# Patient Record
Sex: Female | Born: 2000
Health system: Southern US, Community
[De-identification: ages and names within clinical notes are randomized; demographics above are authoritative.]

## PROBLEM LIST (undated history)

## (undated) DIAGNOSIS — F411 Generalized anxiety disorder: Secondary | ICD-10-CM

## (undated) DIAGNOSIS — R011 Cardiac murmur, unspecified: Secondary | ICD-10-CM

## (undated) DIAGNOSIS — F329 Major depressive disorder, single episode, unspecified: Secondary | ICD-10-CM

## (undated) DIAGNOSIS — J45909 Unspecified asthma, uncomplicated: Secondary | ICD-10-CM

## (undated) HISTORY — DX: Cardiac murmur, unspecified: R01.1

## (undated) HISTORY — DX: Major depressive disorder, single episode, unspecified: F32.9

## (undated) HISTORY — DX: Generalized anxiety disorder: F41.1

---
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
## 2019-07-15 ENCOUNTER — Ambulatory Visit: Payer: Self-pay | Admitting: Orthopedic Surgery

## 2019-07-28 ENCOUNTER — Ambulatory Visit: Payer: Self-pay | Admitting: Orthopedic Surgery

## 2019-07-28 ENCOUNTER — Encounter (HOSPITAL_COMMUNITY)
Admission: RE | Admit: 2019-07-28 | Discharge: 2019-07-28 | Disposition: A | Discharge status: Home / Self Care | Payer: 59 | Source: Ambulatory Visit | Attending: Orthopedic Surgery | Admitting: Orthopedic Surgery

## 2019-07-28 ENCOUNTER — Other Ambulatory Visit: Payer: Self-pay

## 2019-07-28 ENCOUNTER — Other Ambulatory Visit (HOSPITAL_COMMUNITY)
Admission: RE | Admit: 2019-07-28 | Discharge: 2019-07-28 | Disposition: A | Discharge status: Home / Self Care | Payer: 59 | Source: Ambulatory Visit | Attending: Orthopedic Surgery | Admitting: Orthopedic Surgery

## 2019-07-28 ENCOUNTER — Encounter (HOSPITAL_COMMUNITY): Payer: Self-pay

## 2019-07-28 ENCOUNTER — Ambulatory Visit (HOSPITAL_COMMUNITY)
Admission: RE | Admit: 2019-07-28 | Discharge: 2019-07-28 | Disposition: A | Discharge status: Home / Self Care | Payer: 59 | Source: Ambulatory Visit | Attending: Orthopedic Surgery | Admitting: Orthopedic Surgery

## 2019-07-28 DIAGNOSIS — Z01810 Encounter for preprocedural cardiovascular examination: Secondary | ICD-10-CM | POA: Insufficient documentation

## 2019-07-28 DIAGNOSIS — Z01818 Encounter for other preprocedural examination: Secondary | ICD-10-CM

## 2019-07-28 DIAGNOSIS — Z20822 Contact with and (suspected) exposure to covid-19: Secondary | ICD-10-CM | POA: Insufficient documentation

## 2019-07-28 DIAGNOSIS — Z01812 Encounter for preprocedural laboratory examination: Secondary | ICD-10-CM | POA: Diagnosis present

## 2019-07-28 HISTORY — DX: Unspecified asthma, uncomplicated: J45.909

## 2019-07-28 LAB — URINALYSIS, ROUTINE W REFLEX MICROSCOPIC
Bilirubin Urine: NEGATIVE
Glucose, UA: NEGATIVE mg/dL
Hgb urine dipstick: NEGATIVE
Ketones, ur: NEGATIVE mg/dL
Leukocytes,Ua: NEGATIVE
Nitrite: NEGATIVE
Protein, ur: NEGATIVE mg/dL
Specific Gravity, Urine: 1.023 (ref 1.005–1.030)
pH: 5 (ref 5.0–8.0)

## 2019-07-28 LAB — CBC
HCT: 41.8 % (ref 36.0–46.0)
Hemoglobin: 14.2 g/dL (ref 12.0–15.0)
MCH: 30 pg (ref 26.0–34.0)
MCHC: 34 g/dL (ref 30.0–36.0)
MCV: 88.4 fL (ref 80.0–100.0)
Platelets: 315 10*3/uL (ref 150–400)
RBC: 4.73 MIL/uL (ref 3.87–5.11)
RDW: 12.7 % (ref 11.5–15.5)
WBC: 8.3 10*3/uL (ref 4.0–10.5)
nRBC: 0 % (ref 0.0–0.2)

## 2019-07-28 LAB — BASIC METABOLIC PANEL
Anion gap: 10 (ref 5–15)
BUN: 8 mg/dL (ref 6–20)
CO2: 24 mmol/L (ref 22–32)
Calcium: 9.1 mg/dL (ref 8.9–10.3)
Chloride: 100 mmol/L (ref 98–111)
Creatinine, Ser: 0.81 mg/dL (ref 0.44–1.00)
GFR calc Af Amer: >60 mL/min (ref >60)
GFR calc non Af Amer: >60 mL/min (ref >60)
Glucose, Bld: 105 mg/dL — ABNORMAL HIGH (ref 70–99)
Potassium: 3.2 mmol/L — ABNORMAL LOW (ref 3.5–5.1)
Sodium: 134 mmol/L — ABNORMAL LOW (ref 135–145)

## 2019-07-28 LAB — PROTIME-INR
INR: 1.1 (ref 0.8–1.2)
Prothrombin Time: 13.8 seconds (ref 11.4–15.2)

## 2019-07-28 LAB — SARS CORONAVIRUS 2 (TAT 6-24 HRS): SARS Coronavirus 2: NEGATIVE

## 2019-07-28 LAB — APTT: aPTT: 33 seconds (ref 24–36)

## 2019-07-28 NOTE — H&P (Signed)
Subjective:   Michelle Blanchard is a very pleasant 19 year old woman who is been having severe radicular left leg pain for some time now. . Fortunately she has not had any episode of bowel or bladder incontinence to suggest a cauda equina syndrome. Her MRI clearly shows a large central disc herniation slightly worse to the left causing severe central stenosis and neural compression. At this point despite injection therapy and attempted physical therapy her quality-of-life his continue to deteriorate.  Patient has elected to move forward with a surgical decompression/discectomy.   Past Medical History:  Diagnosis Date  . Heart murmur     No past surgical history on file.  Current Outpatient Medications  Medication Sig Dispense Refill Last Dose  . gabapentin (NEURONTIN) 300 MG capsule Take 300 mg by mouth at bedtime.     Marland Kitchen ibuprofen (ADVIL) 200 MG tablet Take 800 mg by mouth every 8 (eight) hours as needed (for pain.).     Marland Kitchen norethindrone-ethinyl estradiol (JUNEL FE,GILDESS FE,LOESTRIN FE) 1-20 MG-MCG tablet Take 1 tablet by mouth at bedtime.       No current facility-administered medications for this visit.   No Known Allergies  Social History   Tobacco Use  . Smoking status: Never Smoker  . Smokeless tobacco: Never Used  Substance Use Topics  . Alcohol use: No    No family history on file.  Review of Systems As stated in HPI  Objective:   Ht: 5 ft 7 in 07/23/2019 01:54 pm Wt: 204 lbs 07/23/2019 01:54 pm BMI: 32 07/23/2019 01:54 pm BP: 178/98 07/23/2019 01:58 pm Pulse: 84 bpm 07/23/2019 01:58 pm T: 97.6 F 07/23/2019 01:58 pm Pain Scale: 7 07/23/2019 01:58 pm  Clinical exam: Michelle Blanchard is a pleasant individual, who appears younger than their stated age. She is alert and orientated 3. No shortness of breath, chest pain. Abdomen is soft and non-tender, negative loss of bowel and bladder control, no rebound tenderness. Negative: skin lesions abrasions contusions  Lungs: Clear to  auscultation bilaterally  Cardiac: Regular rate and rhythm. No rubs gallops murmurs  Peripheral pulses: 2+ dorsalis pedis/posterior tibialis pulses.. Compartment soft and nontender.  Gait pattern: Antalgic gait pattern due to severe radicular left leg pain  Assistive devices: None  Neuro: 5/5 motor strength bilaterally in the lower extremity. Positive left straight leg raise test. Positive left Lhermitte sign. Symmetrical 1+ deep tendon reflexes at the knee and Achilles. Positive numbness and dysesthesias and radicular pain into the S1 dermatome left side.  Musculoskeletal: Significant left gluteal pain that radiates into the left lower extremity. She is also noting left lateral back pain. Primary complaint is the pain radiating into the lower extremity. No significant hip, knee, ankle pain with isolated joint range of motion.  Lumbar MRI: completed on 07/08/19: Large right central and left central disc extrusion causing severe central stenosis and significant compression of the left S1 and S2 nerve roots. There is mild displacement of the descending right S1 nerve roots. Although there is less cranial extension of the disc herniation seen on this MRI compared to the one from 03/11/19 the severity of the spinal stenosis has increased.    Assessment:   Michelle Blanchard is a very pleasant 19 year old woman who is been having severe radicular left leg pain for some time now. Clinically she does not have any focal motor deficits but she does have positive nerve root tension signs as well as numbness and dysesthesias. Fortunately she has not had any episode of bowel or bladder incontinence to suggest  a cauda equina syndrome. Her MRI clearly shows a large central disc herniation slightly worse to the left causing severe central stenosis and neural compression. At this point despite injection therapy and attempted physical therapy her quality-of-life his continue to deteriorate. I have gone over the MRI with the  patient and her mother. All of their questions were encouraged and addressed. Patient has elected to move forward with a surgical decompression/discectomy.  Risks and benefits of decompression/discectomy: Infection, bleeding, death, stroke, paralysis, ongoing or worse pain, need for additional surgery, leak of spinal fluid, adjacent segment degeneration requiring additional surgery, post-operative hematoma formation that can result in neurological compromise and the need for urgent/emergent re-operation. Loss in bowel and bladder control. Injury to major vessels that could result in the need for urgent abdominal surgery to stop bleeding. Risk of deep venous thrombosis (DVT) and the need for additional treatment. Recurrent disc herniation resulting in the need for revision surgery, which could include fusion surgery (utilizing instrumentation such as pedicle screws and intervertebral cages).  Plan:   Treatment plan: Plan on moving forward with surgery in the near future. I specifically discussed since this is been going on for greater than 6 months that her recovery may be prolonged, and there is an increased risk that the disc is calcified and would require more extensive decompression and an increased potential risk for CSF leak. After reviewing the risks and benefits both her and her mother agreed to move forward with surgery.

## 2019-07-28 NOTE — Pre-Procedure Instructions (Signed)
[facility] - [place] - [address] [place] [address] Phone: [phone] Fax: [phone]     Your procedure is scheduled on Thursday, April 15th, from 07:30 AM to 10:00 AM.  Report to [facility] Main Entrance "A" at 05:30 A.M., and check in at the Admitting office.  Call this number if you have problems the morning of surgery:  [phone]  Call [phone] if you have any questions prior to your surgery date Monday-Friday 8am-4pm.    Remember:  Do not eat or drink after midnight the night before your surgery.     Take these medicines the morning of surgery with A SIP OF WATER : NONE   As of today, STOP taking any Aspirin (unless otherwise instructed by your surgeon) and Aspirin containing products, Aleve, Naproxen, Ibuprofen, Motrin, Advil, Goody's, BC's, all herbal medications, fish oil, and all vitamins.                      Do not wear jewelry, make up, or nail polish.            Do not wear lotions, powders, perfumes, or deodorant.            Do not shave 48 hours prior to surgery.             Do not bring valuables to the hospital.            [facility] is not responsible for any belongings or valuables.  Do NOT Smoke (Tobacco/Vapping) or drink Alcohol 24 hours prior to your procedure.  If you use a CPAP at night, you may bring all equipment for your overnight stay.   Contacts, glasses, dentures or bridgework may not be worn into surgery.      For patients admitted to the hospital, discharge time will be determined by your treatment team.   Patients discharged the day of surgery will not be allowed to drive home, and someone needs to stay with them for 24 hours.    Special instructions:   [facility]- Preparing For Surgery  Before surgery, you can play an important role. Because skin is not sterile, your skin needs to be as free of germs as possible. You can reduce the number of germs on your skin by washing with  CHG (chlorahexidine gluconate) Soap before surgery.  CHG is an antiseptic cleaner which kills germs and bonds with the skin to continue killing germs even after washing.    Oral Hygiene is also important to reduce your risk of infection.  Remember - BRUSH YOUR TEETH THE MORNING OF SURGERY WITH YOUR REGULAR TOOTHPASTE  Please do not use if you have an allergy to CHG or antibacterial soaps. If your skin becomes reddened/irritated stop using the CHG.  Do not shave (including legs and underarms) for at least 48 hours prior to first CHG shower. It is OK to shave your face.  Please follow these instructions carefully.   1. Shower the NIGHT BEFORE SURGERY and the MORNING OF SURGERY with CHG Soap.   2. If you chose to wash your hair, wash your hair first as usual with your normal shampoo.  3. After you shampoo, rinse your hair and body thoroughly to remove the shampoo.  4. Use CHG as you would any other liquid soap. You can apply CHG directly to the skin and wash gently with a scrungie or a clean washcloth.   5. Apply the CHG Soap  to your body ONLY FROM THE NECK DOWN.  Do not use on open wounds or open sores. Avoid contact with your eyes, ears, mouth and genitals (private parts). Wash Face and genitals (private parts)  with your normal soap.   6. Wash thoroughly, paying special attention to the area where your surgery will be performed.  7. Thoroughly rinse your body with warm water from the neck down.  8. DO NOT shower/wash with your normal soap after using and rinsing off the CHG Soap.  9. Pat yourself dry with a CLEAN TOWEL.  10. Wear CLEAN PAJAMAS to bed the night before surgery, wear comfortable clothes the morning of surgery  11. Place CLEAN SHEETS on your bed the night of your first shower and DO NOT SLEEP WITH PETS.   Day of Surgery: Do not apply any deodorants/lotions.  Please wear clean clothes to the hospital/surgery center.   Remember to brush your teeth WITH YOUR REGULAR  TOOTHPASTE.   Please read over the following fact sheets that you were given.

## 2019-07-28 NOTE — Progress Notes (Addendum)
PCP -  denies Cardiologist - denies  PPM/ICD - denies   Chest x-ray - 07/28/2019 EKG - 07/28/2019 Stress Test - denies ECHO - denies Cardiac Cath - denies  Sleep Study - denies CPAP - N/A  Blood Thinner Instructions: N/A Aspirin Instructions: N/A  ERAS Protcol - No  COVID TEST-  Scheduled today 07/28/2019 after PAT appointment. Patient verbalized understanding of self-quarantine instructions, appointment time and place.  Anesthesia review: YES, per MD order, hx heart murmur, PA to come over and assess  Patient denies shortness of breath, fever, cough and chest pain at PAT appointment  All instructions explained to the patient, with a verbal understanding of the material. Patient agrees to go over the instructions while at home for a better understanding. Patient also instructed to self quarantine after being tested for COVID-19. The opportunity to ask questions was provided.

## 2019-07-28 NOTE — Progress Notes (Addendum)
Anesthesia PAT Evaluation:  Case: [id number] Date/Time: 07/31/19 0715   Procedure: L5-S1 DISCECTOMY LEFT SIDE (Left ) - 2.5 hrs   Anesthesia type: General   Pre-op diagnosis: Left L5-S1 herniated disc   Location: [facility] ROOM 04 / [facility] OR   Surgeons: Melina Schools, MD      DISCUSSION: Patient is a 19 year old female scheduled for the above procedure.  History includes never smoker, asthma, murmur (childhood, ~ age 19). BMI is consistent with obesity.   I evaluated patient during PAT visit due to reported history of childhood murmur. She states that she was born with a small "hole" in heart mostly closed and was discharged from cardiology by the age of 69. She has never had any activity restrictions or required SBE prophylaxis. She was seeing Cari Caraway, MD when she was born who referred her to pediatric cardiologist Felizardo Hoffmann, MD in [place]. She later saw Rosalyn Charters, MD with [facility] as a pediatrician but now that she is > 1 years old she does not have a PCP. She has never had any issues with her murmur and in fact played softball as a teenager. She did say that she was told her heart rate could be irregular, but she denied any palpitations, syncope, presyncope, chest pain, SOB, edema. 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 Her mother Elle Vezina also called and confirmed the reported cardiac information. She was not aware of the exact terminology for the "hole" either, but said that if [facility] did not have records that Dr. Burt Knack may have more information. She will also let me know if she finds any old medical records at their home.    Attempting to get records from [facility]. 07/28/19 CXR and COVID-19 test in process. Chart will be left for follow-up. She will need urine pregnancy test on the day of surgery.  (UPDATE 07/29/19 9:55 AM: COVID test negative. CXR unremarkable. Awaiting to see if [facility] has additional records, but  reviewed available information with anesthesiologist Albertha Ghee, MD. Patient's mother confirmed history. Patient without CV symptoms or activity limitations prior to back injury. If additional pertinent information received then I will plan to update note, otherwise anesthesia team to evaluate on the day of surgery. UPDATE 07/30/19 3:24 PM: Newborn records received from HIM, but no echo or cardiology notes. Mother Hoyle Sauer called this afternoon. She spoke with her husband who reportedly confirms what she and Jordain remembered about Camyah's murmur history, although he added that murmur was described as "high pitched musical murmur." He did not recall her having an echo. Also, she reported that Rachyl is becoming more anxious, and even tearful, about upcoming surgery. Apparently, a friend's boyfriend recently died in his sleep from suspected cardiac issue which has been difficult to process and adding to her anxiety. Discussed situation with Jovita Kussmaul, RN. If Betta is having significant anxiety/emotionally distress on the morning of surgery then will plan to have her mother Hoyle Sauer present initially in [place] for emotional support.)   VS: BP 129/77   Pulse (!) 107   Temp 36.8 C (Oral)   Resp 17   Ht 5\' 8"  (1.727 m)   Wt 96.2 kg   LMP 07/08/2019   SpO2 99%   BMI 32.25 kg/m  Pleasant, Caucasian female in NAD. No conversational dyspnea. Heart RRR, mildly tachycardic (< 110 bpm during auscultation). I did not appreciate a murmur. Lungs clear. No ankle edema. EKG NSR at  98 bpm.    PROVIDERS: Patient, No Pcp Per   LABS: Labs reviewed: Acceptable for surgery. (all labs ordered are listed, but only abnormal results are displayed)  Labs Reviewed  BASIC METABOLIC PANEL - Abnormal; Notable for the following components:      Result Value   Sodium 134 (*)    Potassium 3.2 (*)    Glucose, Bld 105 (*)    All other components within normal limits  URINALYSIS, ROUTINE W REFLEX MICROSCOPIC -  Abnormal; Notable for the following components:   Color, Urine AMBER (*)    All other components within normal limits  NASOPHARYNGEAL CULTURE  APTT  CBC  PROTIME-INR     IMAGES: CXR 07/28/19:  FINDINGS: The heart size and mediastinal contours are within normal limits. Both lungs are clear. The visualized skeletal structures are unremarkable. IMPRESSION: No active cardiopulmonary disease.   EKG: 07/28/19: NSR   CV: Last echo > 10 years ago.   Past Medical History:  Diagnosis Date  . Asthma    07/28/2019: per patient "had sports induced asthma in the 3rd grade"  . Heart murmur    reported born with "hole" in her heart that mostly closed and discharged from cardiology by ~ age 32 years (had seen Dr. Lorna Few in [place])    No past surgical history on file.  MEDICATIONS: . gabapentin (NEURONTIN) 300 MG capsule  . ibuprofen (ADVIL) 200 MG tablet  . norethindrone-ethinyl estradiol (JUNEL FE,GILDESS FE,LOESTRIN FE) 1-20 MG-MCG tablet   No current facility-administered medications for this encounter.    Shonna Chock, PA-C Surgical Short Stay/Anesthesiology [facility] Phone [phone] [facility] Phone [phone] 07/28/2019 5:41 PM

## 2019-07-29 ENCOUNTER — Encounter (HOSPITAL_COMMUNITY): Payer: Self-pay

## 2019-07-29 NOTE — Anesthesia Preprocedure Evaluation (Addendum)
Anesthesia Evaluation  Patient identified by MRN, date of birth, ID band Patient awake    Reviewed: Allergy & Precautions, NPO status , Patient's Chart, lab work & pertinent test results  Airway Mallampati: II  TM Distance: >3 FB Neck ROM: Full    Dental no notable dental hx. (+) Teeth Intact   Pulmonary asthma ,    Pulmonary exam normal breath sounds clear to auscultation       Cardiovascular Normal cardiovascular exam+ Valvular Problems/Murmurs  Rhythm:Regular Rate:Normal     Neuro/Psych negative neurological ROS  negative psych ROS   GI/Hepatic negative GI ROS, Neg liver ROS,   Endo/Other  Obesity  Renal/GU negative Renal ROS  negative genitourinary   Musculoskeletal negative musculoskeletal ROS (+) L5-S1 herniated disc   Abdominal (+) + obese,   Peds  Hematology negative hematology ROS (+)   Anesthesia Other Findings   Reproductive/Obstetrics                            Anesthesia Physical Anesthesia Plan  ASA: II  Anesthesia Plan: General   Post-op Pain Management:    Induction: Intravenous  PONV Risk Score and Plan: 4 or greater and Scopolamine patch - Pre-op, Midazolam, Dexamethasone, Ondansetron and Treatment may vary due to age or medical condition  Airway Management Planned: Oral ETT  Additional Equipment:   Intra-op Plan:   Post-operative Plan: Extubation in OR  Informed Consent: I have reviewed the patients History and Physical, chart, labs and discussed the procedure including the risks, benefits and alternatives for the proposed anesthesia with the patient or authorized representative who has indicated his/her understanding and acceptance.     Dental advisory given  Plan Discussed with: CRNA and Surgeon  Anesthesia Plan Comments: (See PAT note written 07/29/2019 by Shonna Chock, PA-C. )      Anesthesia Quick Evaluation

## 2019-07-30 ENCOUNTER — Encounter (HOSPITAL_COMMUNITY): Payer: Self-pay | Admitting: Orthopedic Surgery

## 2019-07-30 LAB — NASOPHARYNGEAL CULTURE: Culture: NORMAL

## 2019-07-31 ENCOUNTER — Other Ambulatory Visit: Payer: Self-pay

## 2019-07-31 ENCOUNTER — Ambulatory Visit (HOSPITAL_COMMUNITY)
Admission: RE | Admit: 2019-07-31 | Discharge: 2019-07-31 | Disposition: A | Discharge status: Home / Self Care | Payer: 59 | Attending: Orthopedic Surgery | Admitting: Orthopedic Surgery

## 2019-07-31 ENCOUNTER — Ambulatory Visit (HOSPITAL_COMMUNITY): Payer: 59

## 2019-07-31 ENCOUNTER — Encounter (HOSPITAL_COMMUNITY): Payer: Self-pay | Admitting: Orthopedic Surgery

## 2019-07-31 ENCOUNTER — Ambulatory Visit (HOSPITAL_COMMUNITY): Payer: 59 | Admitting: Anesthesiology

## 2019-07-31 ENCOUNTER — Ambulatory Visit (HOSPITAL_COMMUNITY)
Admission: RE | Disposition: A | Discharge status: Home / Self Care | Payer: Self-pay | Source: Home / Self Care | Attending: Orthopedic Surgery

## 2019-07-31 ENCOUNTER — Ambulatory Visit (HOSPITAL_COMMUNITY): Payer: 59 | Admitting: Vascular Surgery

## 2019-07-31 DIAGNOSIS — M5117 Intervertebral disc disorders with radiculopathy, lumbosacral region: Secondary | ICD-10-CM | POA: Diagnosis not present

## 2019-07-31 DIAGNOSIS — M5126 Other intervertebral disc displacement, lumbar region: Secondary | ICD-10-CM | POA: Diagnosis present

## 2019-07-31 DIAGNOSIS — Z6832 Body mass index (BMI) 32.0-32.9, adult: Secondary | ICD-10-CM | POA: Diagnosis not present

## 2019-07-31 DIAGNOSIS — J45909 Unspecified asthma, uncomplicated: Secondary | ICD-10-CM | POA: Diagnosis not present

## 2019-07-31 DIAGNOSIS — Z419 Encounter for procedure for purposes other than remedying health state, unspecified: Secondary | ICD-10-CM

## 2019-07-31 DIAGNOSIS — M4807 Spinal stenosis, lumbosacral region: Secondary | ICD-10-CM | POA: Insufficient documentation

## 2019-07-31 DIAGNOSIS — E669 Obesity, unspecified: Secondary | ICD-10-CM | POA: Insufficient documentation

## 2019-07-31 HISTORY — PX: LUMBAR LAMINECTOMY/DECOMPRESSION MICRODISCECTOMY: SHX5026

## 2019-07-31 LAB — POCT PREGNANCY, URINE: Preg Test, Ur: NEGATIVE

## 2019-07-31 SURGERY — LUMBAR LAMINECTOMY/DECOMPRESSION MICRODISCECTOMY 1 LEVEL
Anesthesia: General | Site: Back | Laterality: Left

## 2019-07-31 MED ORDER — MORPHINE SULFATE (PF) 2 MG/ML IV SOLN: 2.0000 mg | INTRAVENOUS | Status: DC | PRN

## 2019-07-31 MED ORDER — METHYLPREDNISOLONE ACETATE 40 MG/ML IJ SUSP
INTRAMUSCULAR | Status: AC
  Filled 2019-07-31: qty 1

## 2019-07-31 MED ORDER — FENTANYL CITRATE (PF) 250 MCG/5ML IJ SOLN
INTRAMUSCULAR | Status: DC | PRN
  Administered 2019-07-31: 150 ug via INTRAVENOUS
  Administered 2019-07-31: 50 ug via INTRAVENOUS

## 2019-07-31 MED ORDER — ACETAMINOPHEN 325 MG PO TABS: 650.0000 mg | ORAL_TABLET | ORAL | Status: DC | PRN

## 2019-07-31 MED ORDER — METHOCARBAMOL 1000 MG/10ML IJ SOLN
500.0000 mg | Freq: Four times a day (QID) | INTRAVENOUS | Status: DC | PRN
  Filled 2019-07-31: qty 5

## 2019-07-31 MED ORDER — SUCCINYLCHOLINE CHLORIDE 200 MG/10ML IV SOSY
PREFILLED_SYRINGE | INTRAVENOUS | Status: AC
  Filled 2019-07-31: qty 10

## 2019-07-31 MED ORDER — DOCUSATE SODIUM 100 MG PO CAPS: 100.0000 mg | ORAL_CAPSULE | Freq: Two times a day (BID) | ORAL | Status: DC

## 2019-07-31 MED ORDER — ONDANSETRON HCL 4 MG PO TABS: 4.0000 mg | ORAL_TABLET | Freq: Three times a day (TID) | ORAL | 0 refills | Status: DC | PRN

## 2019-07-31 MED ORDER — MIDAZOLAM HCL 2 MG/2ML IJ SOLN
INTRAMUSCULAR | Status: AC
  Filled 2019-07-31: qty 2

## 2019-07-31 MED ORDER — THROMBIN 20000 UNITS EX SOLR: CUTANEOUS | Status: DC | PRN

## 2019-07-31 MED ORDER — LACTATED RINGERS IV SOLN: INTRAVENOUS | Status: DC

## 2019-07-31 MED ORDER — SCOPOLAMINE 1 MG/3DAYS TD PT72
1.0000 | MEDICATED_PATCH | TRANSDERMAL | Status: DC
  Administered 2019-07-31: 1.5 mg via TRANSDERMAL

## 2019-07-31 MED ORDER — ACETAMINOPHEN 650 MG RE SUPP: 650.0000 mg | RECTAL | Status: DC | PRN

## 2019-07-31 MED ORDER — METHOCARBAMOL 500 MG PO TABS
500.0000 mg | ORAL_TABLET | Freq: Four times a day (QID) | ORAL | Status: DC | PRN
  Administered 2019-07-31 (×2): 500 mg via ORAL
  Filled 2019-07-31: qty 1

## 2019-07-31 MED ORDER — LIDOCAINE 2% (20 MG/ML) 5 ML SYRINGE
INTRAMUSCULAR | Status: AC
  Filled 2019-07-31: qty 5

## 2019-07-31 MED ORDER — PHENYLEPHRINE 40 MCG/ML (10ML) SYRINGE FOR IV PUSH (FOR BLOOD PRESSURE SUPPORT)
PREFILLED_SYRINGE | INTRAVENOUS | Status: AC
  Filled 2019-07-31: qty 10

## 2019-07-31 MED ORDER — METOCLOPRAMIDE HCL 5 MG/ML IJ SOLN: 10.0000 mg | Freq: Once | INTRAMUSCULAR | Status: DC | PRN

## 2019-07-31 MED ORDER — PROPOFOL 10 MG/ML IV BOLUS
INTRAVENOUS | Status: DC | PRN
  Administered 2019-07-31: 200 mg via INTRAVENOUS

## 2019-07-31 MED ORDER — METHYLPREDNISOLONE ACETATE 40 MG/ML INJ SUSP (RADIOLOG: INTRAMUSCULAR | Status: DC | PRN

## 2019-07-31 MED ORDER — MEPERIDINE HCL 25 MG/ML IJ SOLN: 6.2500 mg | INTRAMUSCULAR | Status: DC | PRN

## 2019-07-31 MED ORDER — BUPIVACAINE HCL (PF) 0.25 % IJ SOLN
INTRAMUSCULAR | Status: AC
  Filled 2019-07-31: qty 30

## 2019-07-31 MED ORDER — EPINEPHRINE PF 1 MG/ML IJ SOLN
INTRAMUSCULAR | Status: DC | PRN
  Administered 2019-07-31: .15 mL

## 2019-07-31 MED ORDER — SODIUM CHLORIDE 0.9% FLUSH: 3.0000 mL | INTRAVENOUS | Status: DC | PRN

## 2019-07-31 MED ORDER — DEXAMETHASONE SODIUM PHOSPHATE 10 MG/ML IJ SOLN
INTRAMUSCULAR | Status: DC | PRN
  Administered 2019-07-31: 10 mg via INTRAVENOUS

## 2019-07-31 MED ORDER — OXYCODONE HCL 5 MG PO TABS
10.0000 mg | ORAL_TABLET | ORAL | Status: DC | PRN
  Administered 2019-07-31 (×2): 10 mg via ORAL
  Filled 2019-07-31 (×2): qty 2

## 2019-07-31 MED ORDER — MIDAZOLAM HCL 5 MG/5ML IJ SOLN
INTRAMUSCULAR | Status: DC | PRN
  Administered 2019-07-31: 2 mg via INTRAVENOUS

## 2019-07-31 MED ORDER — GABAPENTIN 300 MG PO CAPS
300.0000 mg | ORAL_CAPSULE | Freq: Three times a day (TID) | ORAL | Status: DC
  Administered 2019-07-31: 300 mg via ORAL
  Filled 2019-07-31: qty 1

## 2019-07-31 MED ORDER — PHENOL 1.4 % MT LIQD: 1.0000 | OROMUCOSAL | Status: DC | PRN

## 2019-07-31 MED ORDER — PROPOFOL 10 MG/ML IV BOLUS
INTRAVENOUS | Status: AC
  Filled 2019-07-31: qty 20

## 2019-07-31 MED ORDER — MENTHOL 3 MG MT LOZG: 1.0000 | LOZENGE | OROMUCOSAL | Status: DC | PRN

## 2019-07-31 MED ORDER — SODIUM CHLORIDE 0.9% FLUSH
3.0000 mL | Freq: Two times a day (BID) | INTRAVENOUS | Status: DC
  Administered 2019-07-31: 3 mL via INTRAVENOUS

## 2019-07-31 MED ORDER — METHYLPREDNISOLONE ACETATE 40 MG/ML IJ SUSP
INTRAMUSCULAR | Status: DC | PRN
  Administered 2019-07-31: 40 mg

## 2019-07-31 MED ORDER — OXYCODONE HCL 5 MG PO TABS: 5.0000 mg | ORAL_TABLET | Freq: Once | ORAL | Status: DC | PRN

## 2019-07-31 MED ORDER — ONDANSETRON HCL 4 MG/2ML IJ SOLN
INTRAMUSCULAR | Status: DC | PRN
  Administered 2019-07-31: 4 mg via INTRAVENOUS

## 2019-07-31 MED ORDER — CEFAZOLIN SODIUM-DEXTROSE 1-4 GM/50ML-% IV SOLN
1.0000 g | Freq: Three times a day (TID) | INTRAVENOUS | Status: DC
  Administered 2019-07-31: 1 g via INTRAVENOUS

## 2019-07-31 MED ORDER — TRANEXAMIC ACID 1000 MG/10ML IV SOLN
INTRAVENOUS | Status: DC | PRN
  Administered 2019-07-31: 1000 mg via INTRAVENOUS

## 2019-07-31 MED ORDER — ROCURONIUM BROMIDE 10 MG/ML (PF) SYRINGE
PREFILLED_SYRINGE | INTRAVENOUS | Status: AC
  Filled 2019-07-31: qty 10

## 2019-07-31 MED ORDER — HYDROMORPHONE HCL 1 MG/ML IJ SOLN
0.2500 mg | INTRAMUSCULAR | Status: DC | PRN
  Administered 2019-07-31 (×2): 0.25 mg via INTRAVENOUS

## 2019-07-31 MED ORDER — EPHEDRINE 5 MG/ML INJ
INTRAVENOUS | Status: AC
  Filled 2019-07-31: qty 10

## 2019-07-31 MED ORDER — HEMOSTATIC AGENTS (NO CHARGE) OPTIME
TOPICAL | Status: DC | PRN
  Administered 2019-07-31: 1

## 2019-07-31 MED ORDER — BUPIVACAINE HCL (PF) 0.25 % IJ SOLN
INTRAMUSCULAR | Status: DC | PRN
  Administered 2019-07-31: 10 mL

## 2019-07-31 MED ORDER — ACETAMINOPHEN 10 MG/ML IV SOLN
INTRAVENOUS | Status: DC | PRN
  Administered 2019-07-31: 1000 mg via INTRAVENOUS

## 2019-07-31 MED ORDER — ROCURONIUM BROMIDE 10 MG/ML (PF) SYRINGE
PREFILLED_SYRINGE | INTRAVENOUS | Status: DC | PRN
  Administered 2019-07-31: 60 mg via INTRAVENOUS

## 2019-07-31 MED ORDER — ONDANSETRON HCL 4 MG/2ML IJ SOLN: 4.0000 mg | Freq: Four times a day (QID) | INTRAMUSCULAR | Status: DC | PRN

## 2019-07-31 MED ORDER — OXYCODONE HCL 5 MG/5ML PO SOLN: 5.0000 mg | Freq: Once | ORAL | Status: DC | PRN

## 2019-07-31 MED ORDER — TRANEXAMIC ACID-NACL 1000-0.7 MG/100ML-% IV SOLN
INTRAVENOUS | Status: AC
  Filled 2019-07-31: qty 100

## 2019-07-31 MED ORDER — THROMBIN (RECOMBINANT) 20000 UNITS EX SOLR
CUTANEOUS | Status: AC
  Filled 2019-07-31: qty 20000

## 2019-07-31 MED ORDER — OXYCODONE-ACETAMINOPHEN 10-325 MG PO TABS: 1.0000 | ORAL_TABLET | Freq: Four times a day (QID) | ORAL | 0 refills | Status: AC | PRN

## 2019-07-31 MED ORDER — SODIUM CHLORIDE 0.9 % IV SOLN: 250.0000 mL | INTRAVENOUS | Status: DC

## 2019-07-31 MED ORDER — OXYCODONE HCL 5 MG PO TABS: 5.0000 mg | ORAL_TABLET | ORAL | Status: DC | PRN

## 2019-07-31 MED ORDER — NORETHIN ACE-ETH ESTRAD-FE 1-20 MG-MCG PO TABS: 1.0000 | ORAL_TABLET | Freq: Every day | ORAL | Status: DC

## 2019-07-31 MED ORDER — METHOCARBAMOL 500 MG PO TABS: 500.0000 mg | ORAL_TABLET | Freq: Three times a day (TID) | ORAL | 0 refills | Status: AC | PRN

## 2019-07-31 MED ORDER — 0.9 % SODIUM CHLORIDE (POUR BTL) OPTIME
TOPICAL | Status: DC | PRN
  Administered 2019-07-31: 1000 mL

## 2019-07-31 MED ORDER — FENTANYL CITRATE (PF) 250 MCG/5ML IJ SOLN
INTRAMUSCULAR | Status: AC
  Filled 2019-07-31: qty 5

## 2019-07-31 MED ORDER — SCOPOLAMINE 1 MG/3DAYS TD PT72
MEDICATED_PATCH | TRANSDERMAL | Status: AC
  Filled 2019-07-31: qty 1

## 2019-07-31 MED ORDER — EPINEPHRINE PF 1 MG/ML IJ SOLN
INTRAMUSCULAR | Status: AC
  Filled 2019-07-31: qty 1

## 2019-07-31 MED ORDER — POLYETHYLENE GLYCOL 3350 17 G PO PACK: 17.0000 g | PACK | Freq: Every day | ORAL | Status: DC | PRN

## 2019-07-31 MED ORDER — METHOCARBAMOL 500 MG PO TABS
ORAL_TABLET | ORAL | Status: AC
  Filled 2019-07-31: qty 1

## 2019-07-31 MED ORDER — ACETAMINOPHEN 10 MG/ML IV SOLN
INTRAVENOUS | Status: AC
  Filled 2019-07-31: qty 100

## 2019-07-31 MED ORDER — SUGAMMADEX SODIUM 200 MG/2ML IV SOLN
INTRAVENOUS | Status: DC | PRN
  Administered 2019-07-31: 200 mg via INTRAVENOUS

## 2019-07-31 MED ORDER — HYDROMORPHONE HCL 1 MG/ML IJ SOLN
INTRAMUSCULAR | Status: AC
  Filled 2019-07-31: qty 1

## 2019-07-31 MED ORDER — ONDANSETRON HCL 4 MG PO TABS: 4.0000 mg | ORAL_TABLET | Freq: Four times a day (QID) | ORAL | Status: DC | PRN

## 2019-07-31 MED ORDER — CEFAZOLIN SODIUM-DEXTROSE 2-4 GM/100ML-% IV SOLN
2.0000 g | INTRAVENOUS | Status: AC
  Administered 2019-07-31: 08:00:00 2 g via INTRAVENOUS
  Filled 2019-07-31: qty 100

## 2019-07-31 MED ORDER — LIDOCAINE 2% (20 MG/ML) 5 ML SYRINGE
INTRAMUSCULAR | Status: DC | PRN
  Administered 2019-07-31: 100 mg via INTRAVENOUS

## 2019-07-31 SURGICAL SUPPLY — 59 items
BNDG GAUZE ELAST 4 BULKY (GAUZE/BANDAGES/DRESSINGS) ×2 IMPLANT
BUR EGG ELITE 4.0 (BURR) IMPLANT
BUR MATCHSTICK NEURO 3.0 LAGG (BURR) IMPLANT
CANISTER SUCT 3000ML PPV (MISCELLANEOUS) ×2 IMPLANT
CLSR STERI-STRIP ANTIMIC 1/2X4 (GAUZE/BANDAGES/DRESSINGS) ×2 IMPLANT
CORD BIPOLAR FORCEPS 12FT (ELECTRODE) ×2 IMPLANT
COVER SURGICAL LIGHT HANDLE (MISCELLANEOUS) ×2 IMPLANT
COVER WAND RF STERILE (DRAPES) ×2 IMPLANT
DRAIN CHANNEL 15F RND FF W/TCR (WOUND CARE) IMPLANT
DRAPE POUCH INSTRU U-SHP 10X18 (DRAPES) ×2 IMPLANT
DRAPE SURG 17X23 STRL (DRAPES) ×2 IMPLANT
DRAPE U-SHAPE 47X51 STRL (DRAPES) ×2 IMPLANT
DRSG OPSITE POSTOP 3X4 (GAUZE/BANDAGES/DRESSINGS) ×2 IMPLANT
DRSG OPSITE POSTOP 4X6 (GAUZE/BANDAGES/DRESSINGS) ×1 IMPLANT
DURAPREP 26ML APPLICATOR (WOUND CARE) ×2 IMPLANT
ELECT BLADE 4.0 EZ CLEAN MEGAD (MISCELLANEOUS)
ELECT CAUTERY BLADE 6.4 (BLADE) ×2 IMPLANT
ELECT PENCIL ROCKER SW 15FT (MISCELLANEOUS) ×2 IMPLANT
ELECT REM PT RETURN 9FT ADLT (ELECTROSURGICAL) ×2
ELECTRODE BLDE 4.0 EZ CLN MEGD (MISCELLANEOUS) IMPLANT
ELECTRODE REM PT RTRN 9FT ADLT (ELECTROSURGICAL) ×1 IMPLANT
EVACUATOR SILICONE 100CC (DRAIN) IMPLANT
GLOVE BIO SURGEON STRL SZ 6.5 (GLOVE) ×2 IMPLANT
GLOVE BIOGEL PI IND STRL 6.5 (GLOVE) ×1 IMPLANT
GLOVE BIOGEL PI IND STRL 8.5 (GLOVE) ×1 IMPLANT
GLOVE BIOGEL PI INDICATOR 6.5 (GLOVE) ×1
GLOVE BIOGEL PI INDICATOR 8.5 (GLOVE) ×1
GLOVE SS BIOGEL STRL SZ 8.5 (GLOVE) ×1 IMPLANT
GLOVE SUPERSENSE BIOGEL SZ 8.5 (GLOVE) ×1
GOWN STRL REUS W/ TWL LRG LVL3 (GOWN DISPOSABLE) ×2 IMPLANT
GOWN STRL REUS W/TWL 2XL LVL3 (GOWN DISPOSABLE) ×2 IMPLANT
GOWN STRL REUS W/TWL LRG LVL3 (GOWN DISPOSABLE) ×4
KIT BASIN OR (CUSTOM PROCEDURE TRAY) ×2 IMPLANT
KIT TURNOVER KIT B (KITS) ×2 IMPLANT
NDL SPNL 18GX3.5 QUINCKE PK (NEEDLE) ×2 IMPLANT
NEEDLE 22X1 1/2 (OR ONLY) (NEEDLE) ×2 IMPLANT
NEEDLE SPNL 18GX3.5 QUINCKE PK (NEEDLE) ×4 IMPLANT
NS IRRIG 1000ML POUR BTL (IV SOLUTION) ×2 IMPLANT
PACK LAMINECTOMY ORTHO (CUSTOM PROCEDURE TRAY) ×2 IMPLANT
PACK UNIVERSAL I (CUSTOM PROCEDURE TRAY) ×2 IMPLANT
PAD ARMBOARD 7.5X6 YLW CONV (MISCELLANEOUS) ×4 IMPLANT
PATTIES SURGICAL .5 X.5 (GAUZE/BANDAGES/DRESSINGS) ×2 IMPLANT
PATTIES SURGICAL .5 X1 (DISPOSABLE) ×2 IMPLANT
SPONGE SURGIFOAM ABS GEL 100 (HEMOSTASIS) ×1 IMPLANT
SURGIFLO W/THROMBIN 8M KIT (HEMOSTASIS) ×1 IMPLANT
SUT BONE WAX W31G (SUTURE) ×2 IMPLANT
SUT MON AB 3-0 SH 27 (SUTURE) ×2
SUT MON AB 3-0 SH27 (SUTURE) ×1 IMPLANT
SUT VIC AB 0 CT1 27 (SUTURE)
SUT VIC AB 0 CT1 27XBRD ANBCTR (SUTURE) IMPLANT
SUT VIC AB 1 CT1 18XCR BRD 8 (SUTURE) ×1 IMPLANT
SUT VIC AB 1 CT1 8-18 (SUTURE) ×2
SUT VIC AB 2-0 CT1 18 (SUTURE) ×2 IMPLANT
SYR BULB IRRIGATION 50ML (SYRINGE) ×2 IMPLANT
SYR CONTROL 10ML LL (SYRINGE) ×2 IMPLANT
TOWEL GREEN STERILE (TOWEL DISPOSABLE) ×2 IMPLANT
TOWEL GREEN STERILE FF (TOWEL DISPOSABLE) ×2 IMPLANT
WATER STERILE IRR 1000ML POUR (IV SOLUTION) ×2 IMPLANT
YANKAUER SUCT BULB TIP NO VENT (SUCTIONS) IMPLANT

## 2019-07-31 NOTE — Discharge Summary (Signed)
Patient ID: Michelle Blanchard MRN: [id number] DOB/AGE: February 02, 2001 19 y.o.  Admit date: 07/31/2019 Discharge date: 07/31/2019  Admission Diagnoses:  Active Problems:   Lumbar disc herniation   Discharge Diagnoses:  Active Problems:   Lumbar disc herniation  status post Procedure(s): LUMBAR FIVE-SACRAL ONE DISCECTOMY LEFT SIDE  Past Medical History:  Diagnosis Date  . Asthma    07/28/2019: per patient "had sports induced asthma in the 3rd grade"  . Heart murmur    reported born with "hole" in her heart that mostly closed and discharged from cardiology by ~ age 68 years (had seen Dr. Lorna Few in [place])    Surgeries: Procedure(s): LUMBAR FIVE-SACRAL ONE DISCECTOMY LEFT SIDE on 07/31/2019   Consultants:   Discharged Condition: Improved  Hospital Course: Michelle Blanchard is an 19 y.o. female who was admitted 07/31/2019 for operative treatment of Left L5-S1 herniated disc with lumbar radiculopathy. Patient failed conservative treatments (please see the history and physical for the specifics) and had severe unremitting pain that affects sleep, daily activities and work/hobbies. After pre-op clearance, the patient was taken to the operating room on 07/31/2019 and underwent  Procedure(s): LUMBAR FIVE-SACRAL ONE DISCECTOMY LEFT SIDE.    Patient was given perioperative antibiotics:  Anti-infectives (From admission, onward)   Start     Dose/Rate Route Frequency Ordered Stop   07/31/19 1600  ceFAZolin (ANCEF) IVPB 1 g/50 mL premix     1 g 100 mL/hr over 30 Minutes Intravenous Every 8 hours 07/31/19 1215 08/01/19 0759   07/31/19 0600  ceFAZolin (ANCEF) IVPB 2g/100 mL premix     2 g 200 mL/hr over 30 Minutes Intravenous To ShortStay Surgical 07/31/19 0544 07/31/19 0803       Patient was given sequential compression devices and early ambulation to prevent DVT.   Patient benefited maximally from hospital stay and there were no complications. At the time of discharge, the  patient was urinating/moving their bowels without difficulty, tolerating a regular diet, pain is controlled with oral pain medications and they have been cleared by PT/OT.   Recent vital signs:  Patient Vitals for the past 24 hrs:  BP Temp Temp src Pulse Resp SpO2 Height Weight  07/31/19 1220 126/65 98.2 F (36.8 C) Oral 77 18 96 % -- --  07/31/19 1200 133/76 97.9 F (36.6 C) -- 94 14 97 % -- --  07/31/19 1145 136/79 -- -- 94 20 96 % -- --  07/31/19 1140 -- -- -- 90 17 93 % -- --  07/31/19 1130 130/71 -- -- (!) 101 16 93 % -- --  07/31/19 1125 130/71 -- -- (!) 102 (!) 23 93 % -- --  07/31/19 1110 (!) 122/59 97.9 F (36.6 C) -- (!) 110 14 97 % -- --  07/31/19 0555 140/76 98.6 F (37 C) Oral 78 17 99 % 5\' 8"  (1.727 m) 96.2 kg     Recent laboratory studies: No results for input(s): WBC, HGB, HCT, PLT, NA, K, CL, CO2, BUN, CREATININE, GLUCOSE, INR, CALCIUM in the last 72 hours.  Invalid input(s): PT, 2   Discharge Medications:   Allergies as of 07/31/2019   No Known Allergies     Medication List    STOP taking these medications   gabapentin 300 MG capsule Commonly known as: NEURONTIN   ibuprofen 200 MG tablet Commonly known as: ADVIL     TAKE these medications   methocarbamol 500 MG tablet Commonly known as: Robaxin Take 1 tablet (500 mg total) by mouth  every 8 (eight) hours as needed for up to 5 days for muscle spasms.   norethindrone-ethinyl estradiol 1-20 MG-MCG tablet Commonly known as: LOESTRIN FE Take 1 tablet by mouth at bedtime.   ondansetron 4 MG tablet Commonly known as: Zofran Take 1 tablet (4 mg total) by mouth every 8 (eight) hours as needed for nausea or vomiting.   oxyCODONE-acetaminophen 10-325 MG tablet Commonly known as: Percocet Take 1 tablet by mouth every 6 (six) hours as needed for up to 5 days for pain.       Diagnostic Studies: DG Chest 2 View  Result Date: 07/29/2019 CLINICAL DATA:  Preop EXAM: CHEST - 2 VIEW COMPARISON:  None.  FINDINGS: The heart size and mediastinal contours are within normal limits. Both lungs are clear. The visualized skeletal structures are unremarkable. IMPRESSION: No active cardiopulmonary disease. Electronically Signed   By: Kerby Moors M.D.   On: 07/29/2019 02:33   DG Lumbar Spine 1 View  Result Date: 07/31/2019 CLINICAL DATA:  L5-S1 discectomy. EXAM: LUMBAR SPINE - 1 VIEW COMPARISON:  None. FINDINGS: Single intraoperative cross-table lateral projection of the lumbar spine demonstrates surgical probes directed toward the posterior elements of L4 and L5. IMPRESSION: Surgical localization as described above. Electronically Signed   By: Marijo Conception M.D.   On: 07/31/2019 13:26   DG Lumbar Spine 1 View  Result Date: 07/31/2019 CLINICAL DATA:  19 year old female for discectomy EXAM: LUMBAR SPINE - 1 VIEW COMPARISON:  None. FINDINGS: Single cross-table lateral of the lumbar spine, intraop. Surgical curette terminates at the level of the L5-S1 disc space. Tissue retractors in the soft tissues. IMPRESSION: Single cross-table lateral of the lumbar spine demonstrates a surgical curette terminating at the L5-S1 disc space. These results were called by telephone at the time of interpretation on 07/31/2019 at 8:33 am to Dr. Melina Schools, via [employer]. Electronically Signed   By: Corrie Mckusick D.O.   On: 07/31/2019 08:35    Discharge Instructions    Incentive spirometry RT   Complete by: As directed       Follow-up Information    Melina Schools, MD. Schedule an appointment as soon as possible for a visit in 2 weeks.   Specialty: Orthopedic Surgery Why: If symptoms worsen, For suture removal, For wound re-check Contact information: [address] [place] [address] [phone]           Discharge Plan:  discharge to home  Disposition: stable    Signed: Yvonne Kendall Lorry Anastasi for [facility] PA-C [facility] [phone] 07/31/2019, 3:21 PM

## 2019-07-31 NOTE — Plan of Care (Signed)
Patient alert and oriented, mae's well, voiding adequate amount of urine, swallowing without difficulty, no c/o pain at time of discharge. Patient discharged home with family. Script and discharged instructions given to patient. Patient and family stated understanding of instructions given. Patient has an appointment with Dr. Brooks  

## 2019-07-31 NOTE — Op Note (Signed)
Operative report  Preoperative diagnosis: Large central and left-sided disc herniation L5-S1 (chronic) with S1 radiculopathy  Postoperative diagnosis: Same  Operative procedure: Left hemilaminotomy and facetectomy for decompression/discectomy.  Complications: None  First Assistant: Glynis Smiles, PA  Indications: Michelle Blanchard is a very pleasant 19 year old woman who is been having severe back and radicular leg pain on the left side for almost 1 year.  Imaging studies demonstrate a large central slightly to the left disc herniation at L5-S1 with left S1 radiculopathy.  Imaging confirmed severe central and lateral recess stenosis with nerve root compression.  As result of the duration of her symptoms, and the MRI findings we elected to move forward with surgery.  All appropriate risks benefits and alternatives to surgery were discussed and consent was obtained.  Operative report: Patient was brought the operating room and placed supine on the operating room table.  After successful induction of general anesthesia and endotracheal ovation teds SCDs were applied she was turned prone onto the Wilson frame.  The back was then prepped and draped in a standard fashion.  Timeout was taken to confirm patient procedure and all other important data.  2 18-gauge needles were placed into the back and an x-ray was taken for localization incision.  I marked out the incision site and infiltrated with quarter percent Marcaine with epinephrine.  Midline incision was made and sharp dissection was carried out down through the subcutaneous fascia to expose the deep fascia.  I incised the deep fascia and used a Cobb elevator to strip the paraspinal muscles and expose the left side of the L5 and S1 lamina and L5-S1 facet complex.  A Penfield 4 was placed underneath the L5 lamina and a second x-ray was taken confirming that I was at the appropriate level.  Laminotomy was performed with a 3 mm Kerrison rongeur and then I used a  Penfield 4 to dissect through the ligamentum flavum and create a plane between the ligamentum flavum and the underlying thecal sac.  I used my 2 and 3 mm Kerrison rongeur to remove all of the ligamentum flavum on the left side.  I continued to dissect into the lateral recess removing the medial portion of the facet complex.  There was significant dorsal displacement of the thecal sac as well as the S1 nerve root consistent with the stenosis that was seen on preoperative MRI.  Once I was able to identify the posterior annulus of the disc space I then gently began retracting the thecal sac and nerve root medially.  There was significant large central disc herniation causing central stenosis.  Once I was able to identify the S1 nerve root and protected with a neuro patty I then created an annulotomy with a 15 blade scalpel.  Using Epstein curettes I was able to debride the hard disc osteophyte/disc herniation.  Using a nerve hook and pituitary rongeurs as well as the Epstein curettes I ultimately was able to remove multiple large fragments of disc material consistent with what was seen on the preoperative MRI.  Because of the severity of the stenosis I also took down the leading edge of the S1 lamina in order to improve the central decompression.  I also palpated over the dorsal surface of the thecal sac using a Penfield 4 onto the right-hand side.  I was able to indirectly confirm that the right-hand side was adequately decompressed.  At this point the S1 nerve root was completely free and no longer under tension or displacement.  In addition  the I could easily pass my nerve hook as well as the [facility] along the dorsal surface of the annulus confirming adequate central decompression.  There was a large amount of disc material and osteophyte that were resected.  In addition I could palpate superiorly.  At this point with the decompression complete I irrigated the wound copiously with normal saline.  I then  obtained hemostasis using bipolar electrocautery as well as FloSeal.  After final irrigation I checked 1 last time to ensure there was no further fragments of disc material or significant residual stenosis.  I then placed Depo-Medrol over the thecal sac and nerve root for adequate postoperative analgesia.  I then remove the retractor and then closed the deep fascia with interrupted #1 Vicryl sutures.  I then closed the remainder of the incision in a layered fashion with a running 0 Vicryl, 2-0 Vicryl, and a 3-0 Monocryl for skin.  Steri-Strips dry dressing were applied and the patient was ultimately extubated transfer the PACU without incident.  The end of the case all needle sponge counts were correct.

## 2019-07-31 NOTE — Evaluation (Addendum)
Physical Therapy Evaluation and Discharge Patient Details Name: Michelle Blanchard MRN: [id number] DOB: [birth year] Today's Date: 07/31/2019   History of Present Illness  Pt is a 19 y/o female s/p L5-S1 discectomy. No pertinent PMH on file.   Clinical Impression  Patient evaluated by Physical Therapy with no further acute PT needs identified. All education has been completed and the patient has no further questions. Pt requiring min guard to supervision for safety during gait and stair navigation. Did not require any physical assist. Educated about back precautions, maintaining during ADLs, walking program, and car transfer. Pt reports family will be able to assist as needed.  See below for any follow-up Physical Therapy or equipment needs. PT is signing off. Thank you for this referral. If needs change, please re-consult.      Follow Up Recommendations No PT follow up    Equipment Recommendations  None recommended by PT    Recommendations for Other Services       Precautions / Restrictions Precautions Precautions: Back Precaution Booklet Issued: Yes (comment) Precaution Comments: reviewed back precautions with pt. Reviewed how to maintain during ADL tasks and had pt practice performing figure 4 position in sitting for LE dressing.  Required Braces or Orthoses: Spinal Brace Spinal Brace: Lumbar corset;Applied in sitting position Restrictions Weight Bearing Restrictions: No      Mobility  Bed Mobility Overal bed mobility: Needs Assistance Bed Mobility: Rolling;Sidelying to Sit;Sit to Sidelying Rolling: Supervision Sidelying to sit: Supervision     Sit to sidelying: Supervision General bed mobility comments: Supervision for safety. Educated about using log roll technique to maintain precautions.   Transfers Overall transfer level: Needs assistance Equipment used: None Transfers: Sit to/from Stand Sit to Stand: Supervision         General transfer comment: Supervision for  safety.   Ambulation/Gait Ambulation/Gait assistance: Supervision Gait Distance (Feet): 150 Feet Assistive device: None Gait Pattern/deviations: Step-through pattern;Decreased step length - right;Decreased step length - left;Decreased weight shift to left;Antalgic Gait velocity: Decreased   General Gait Details: Slow, antalgic gait secondary to LLE pain. Pt reports she has pain at baseline. Required supervision for safety. Educated about generalized walking program to perform at home.   Stairs Stairs: Yes Stairs assistance: Min guard Stair Management: One rail Right;Step to pattern;Forwards Number of Stairs: 4 General stair comments: Practiced step to pattern for stair navigation. Pt requiring min guard for safety, but no physical assist required.   Wheelchair Mobility    Modified Rankin (Stroke Patients Only)       Balance Overall balance assessment: Mild deficits observed, not formally tested                                           Pertinent Vitals/Pain Pain Assessment: Faces Faces Pain Scale: Hurts little more Pain Location: back and LLE  Pain Descriptors / Indicators: Operative site guarding;Aching Pain Intervention(s): Limited activity within patient's tolerance;Monitored during session;Repositioned    Home Living Family/patient expects to be discharged to:: Private residence Living Arrangements: Parent Available Help at Discharge: Family;Available 24 hours/day Type of Home: House Home Access: Stairs to enter Entrance Stairs-Rails: None Entrance Stairs-Number of Steps: 1 Home Layout: Multi-level Home Equipment: None      Prior Function Level of Independence: Independent               Hand Dominance  Extremity/Trunk Assessment   Upper Extremity Assessment Upper Extremity Assessment: Overall WFL for tasks assessed    Lower Extremity Assessment Lower Extremity Assessment: LLE deficits/detail LLE Deficits / Details:  Reports pain in LLE, however, had this at baseline     Cervical / Trunk Assessment Cervical / Trunk Assessment: Other exceptions Cervical / Trunk Exceptions: s/p lumbar surgery   Communication   Communication: No difficulties  Cognition Arousal/Alertness: Awake/alert Behavior During Therapy: WFL for tasks assessed/performed Overall Cognitive Status: Within Functional Limits for tasks assessed                                        General Comments General comments (skin integrity, edema, etc.): Educated about car transfer while maintaining precautions.     Exercises     Assessment/Plan    PT Assessment Patent does not need any further PT services  PT Problem List         PT Treatment Interventions      PT Goals (Current goals can be found in the Care Plan section)  Acute Rehab PT Goals Patient Stated Goal: to go home PT Goal Formulation: With patient Time For Goal Achievement: 07/31/19 Potential to Achieve Goals: Good    Frequency     Barriers to discharge        Co-evaluation               AM-PAC PT "6 Clicks" Mobility  Outcome Measure Help needed turning from your back to your side while in a flat bed without using bedrails?: None Help needed moving from lying on your back to sitting on the side of a flat bed without using bedrails?: None Help needed moving to and from a bed to a chair (including a wheelchair)?: None Help needed standing up from a chair using your arms (e.g., wheelchair or bedside chair)?: None Help needed to walk in hospital room?: None Help needed climbing 3-5 steps with a railing? : A Little 6 Click Score: 23    End of Session Equipment Utilized During Treatment: Gait belt;Back brace Activity Tolerance: Patient tolerated treatment well Patient left: in bed;with call bell/phone within reach;with family/visitor present Nurse Communication: Mobility status PT Visit Diagnosis: Pain;Other abnormalities of gait and  mobility (R26.89) Pain - Right/Left: Left Pain - part of body: Leg(back)    Time: 2353-6144 PT Time Calculation (min) (ACUTE ONLY): 24 min   Charges:   PT Evaluation $PT Eval Low Complexity: 1 Low PT Treatments $Gait Training: 8-22 mins        Michelle Blanchard, DPT  Acute Rehabilitation Services  Pager: [phone] Office: [phone]   Michelle Blanchard 07/31/2019, 3:59 PM

## 2019-07-31 NOTE — OR Nursing (Signed)
PER PROTOCOL DR.WAGNOR CALLED AT 08:35 A.M ON 07/31/2019 AND CONFIRMED INSTRUMENTATION WAS AT L5-S1 DISC SPACE.

## 2019-07-31 NOTE — Discharge Instructions (Signed)

## 2019-07-31 NOTE — Brief Op Note (Signed)
07/31/2019  10:58 AM  PATIENT:  Michelle Blanchard  18 y.o. female  PRE-OPERATIVE DIAGNOSIS:  Left L5-S1 herniated disc  POST-OPERATIVE DIAGNOSIS:  Left L5-S1 herniated disc  PROCEDURE:  Procedure(s) with comments: LUMBAR FIVE-SACRAL ONE DISCECTOMY LEFT SIDE (Left) - 2.5 hrs  SURGEON:  Surgeon(s) and Role:    Venita Lick, MD - Primary  PHYSICIAN ASSISTANT:   ASSISTANTS: Amanda Ward, PA   ANESTHESIA:   general  EBL:  50 mL   BLOOD ADMINISTERED:none  DRAINS: none   LOCAL MEDICATIONS USED:  MARCAINE    and OTHER depomedrol  SPECIMEN:  No Specimen  DISPOSITION OF SPECIMEN:  N/A  COUNTS:  YES  TOURNIQUET:  * No tourniquets in log *  DICTATION: .Dragon Dictation  PLAN OF CARE: Admit for overnight observation  PATIENT DISPOSITION:  PACU - hemodynamically stable.

## 2019-07-31 NOTE — H&P (Signed)
Addendum H&P  There has been no change in the patient's clinical exam since her last office visit of 07/28/2019.  She continues to have significant radicular left leg pain and sensory loss in the S1 dermatome.  Imaging studies confirmed a large L5-S1 left disc extrusion causing severe central and left lateral recess stenosis with compression of the S1 nerve root.  Plan on moving forward with a L5-S1 left-sided decompression/discectomy for radicular left leg pain.  I have gone over the risks and benefits of surgery with the patient and her mother who are present and all their questions were addressed again.

## 2019-07-31 NOTE — Anesthesia Procedure Notes (Signed)
Procedure Name: Intubation Date/Time: 07/31/2019 7:58 AM Performed by: Myna Bright, CRNA Pre-anesthesia Checklist: Patient identified, Emergency Drugs available, Patient being monitored and Suction available Patient Re-evaluated:Patient Re-evaluated prior to induction Oxygen Delivery Method: Circle system utilized Preoxygenation: Pre-oxygenation with 100% oxygen Induction Type: IV induction Ventilation: Mask ventilation without difficulty Laryngoscope Size: Mac and 3 Grade View: Grade I Tube type: Oral Tube size: 7.0 mm Number of attempts: 1 Airway Equipment and Method: Stylet Placement Confirmation: ETT inserted through vocal cords under direct vision,  positive ETCO2 and breath sounds checked- equal and bilateral Secured at: 21 cm Tube secured with: Tape Dental Injury: Teeth and Oropharynx as per pre-operative assessment

## 2019-07-31 NOTE — Transfer of Care (Signed)
Immediate Anesthesia Transfer of Care Note  Patient: Michelle Blanchard  Procedure(s) Performed: LUMBAR FIVE-SACRAL ONE DISCECTOMY LEFT SIDE (Left Back)  Patient Location: PACU  Anesthesia Type:General  Level of Consciousness: awake, alert , oriented and patient cooperative  Airway & Oxygen Therapy: Patient Spontanous Breathing and Patient connected to nasal cannula oxygen  Post-op Assessment: Report given to RN, Post -op Vital signs reviewed and stable and Patient moving all extremities  Post vital signs: Reviewed and stable  Last Vitals:  Vitals Value Taken Time  BP 122/59 07/31/19 1112  Temp 36.6 C 07/31/19 1110  Pulse 102 07/31/19 1114  Resp 26 07/31/19 1114  SpO2 94 % 07/31/19 1114  Vitals shown include unvalidated device data.  Last Pain:  Vitals:   07/31/19 0610  TempSrc:   PainSc: 7       Patients Stated Pain Goal: 3 (07/31/19 0610)  Complications: No apparent anesthesia complications

## 2019-07-31 NOTE — Progress Notes (Signed)
Called and notified Mom of patients room assignment.   Hermina Barters, RN

## 2019-07-31 NOTE — Anesthesia Postprocedure Evaluation (Signed)
Anesthesia Post Note  Patient: Michelle Blanchard  Procedure(s) Performed: LUMBAR FIVE-SACRAL ONE DISCECTOMY LEFT SIDE (Left Back)     Patient location during evaluation: PACU Anesthesia Type: General Level of consciousness: awake and alert and oriented Pain management: pain level controlled Vital Signs Assessment: post-procedure vital signs reviewed and stable Respiratory status: spontaneous breathing, nonlabored ventilation and respiratory function stable Cardiovascular status: blood pressure returned to baseline and stable Postop Assessment: no apparent nausea or vomiting Anesthetic complications: no    Last Vitals:  Vitals:   07/31/19 1200 07/31/19 1220  BP: 133/76 126/65  Pulse: 94 77  Resp: 14 18  Temp: 36.6 C 36.8 C  SpO2: 97% 96%    Last Pain:  Vitals:   07/31/19 1220  TempSrc: Oral  PainSc:                  Coty Student A.

## 2019-08-01 MED FILL — Thrombin (Recombinant) For Soln 20000 Unit: CUTANEOUS | Qty: 1 | Status: AC

## 2021-03-13 IMAGING — CR DG CHEST 2V
2 series · 2 of 2 positions shown · non-contrast
Comparison: None.

CLINICAL DATA: Preop

EXAM:
CHEST - 2 VIEW

[w chest pa]
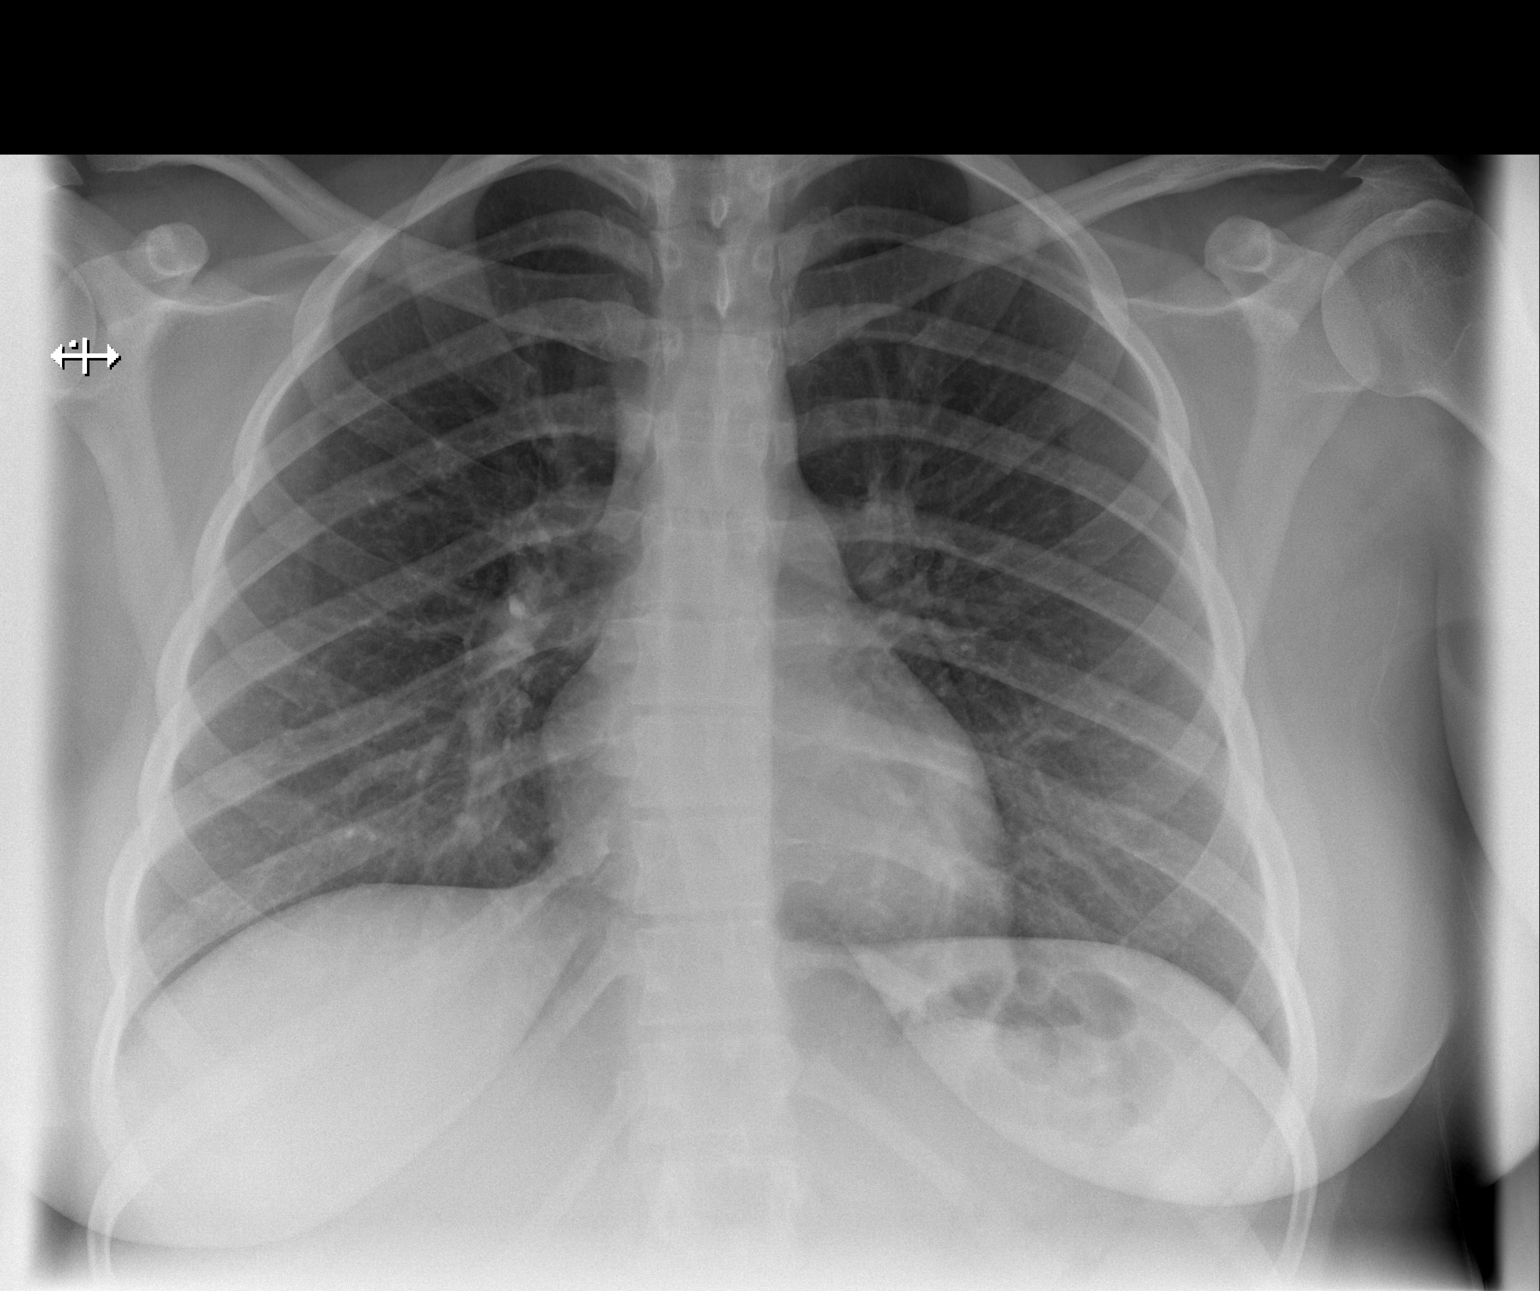

[w chest lat]
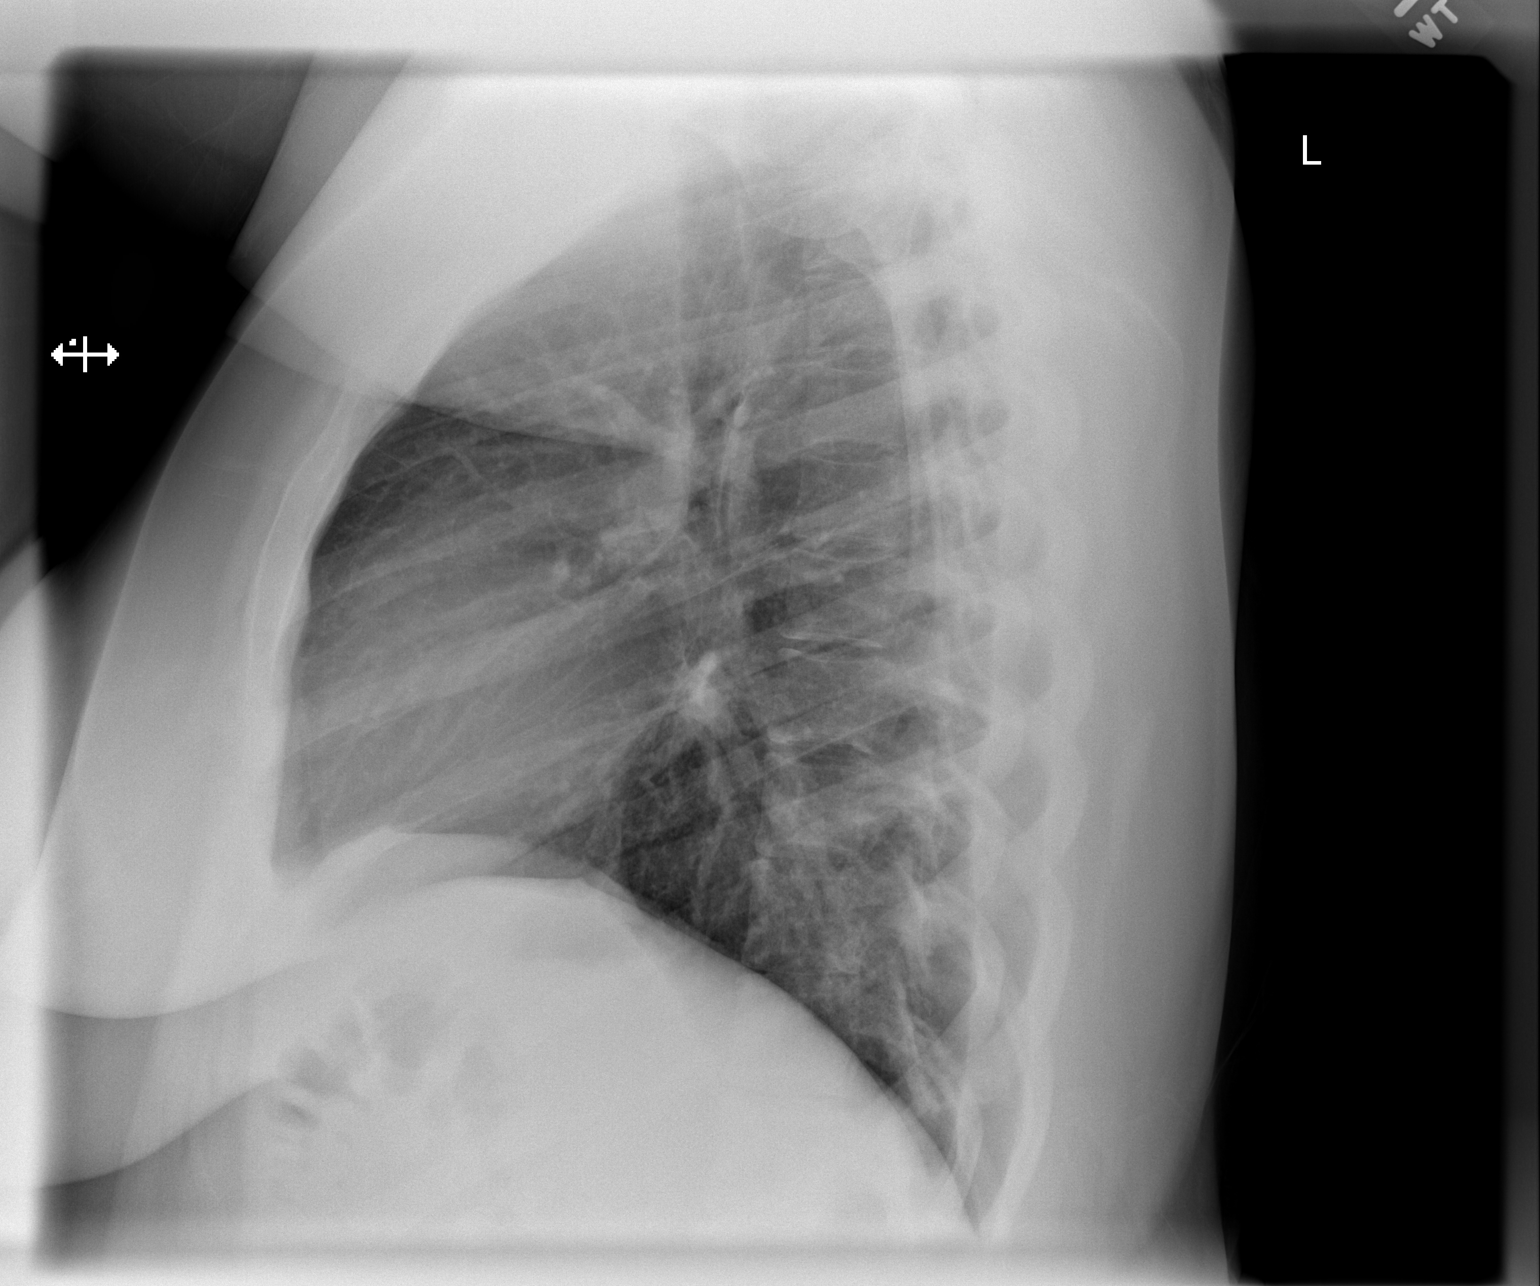

[2 of 2 positions shown; findings below may reference images not displayed]

FINDINGS: The heart size and mediastinal contours are within normal limits.
Both lungs are clear. The visualized skeletal structures are
unremarkable.
IMPRESSION: No active cardiopulmonary disease.

## 2021-04-12 ENCOUNTER — Encounter: Payer: Self-pay | Admitting: Gastroenterology

## 2021-05-09 ENCOUNTER — Ambulatory Visit (INDEPENDENT_AMBULATORY_CARE_PROVIDER_SITE_OTHER): Payer: 59 | Admitting: Gastroenterology

## 2021-05-09 ENCOUNTER — Other Ambulatory Visit (INDEPENDENT_AMBULATORY_CARE_PROVIDER_SITE_OTHER): Payer: 59

## 2021-05-09 ENCOUNTER — Encounter: Payer: Self-pay | Admitting: Gastroenterology

## 2021-05-09 VITALS — BP 128/88 | HR 92 | Ht 68.0 in | Wt 222.0 lb

## 2021-05-09 DIAGNOSIS — R140 Abdominal distension (gaseous): Secondary | ICD-10-CM

## 2021-05-09 DIAGNOSIS — R112 Nausea with vomiting, unspecified: Secondary | ICD-10-CM | POA: Diagnosis not present

## 2021-05-09 DIAGNOSIS — R197 Diarrhea, unspecified: Secondary | ICD-10-CM

## 2021-05-09 LAB — C-REACTIVE PROTEIN: CRP: <1 mg/dL (ref 0.5–20.0)

## 2021-05-09 NOTE — Progress Notes (Signed)
HPI : Michelle Blanchard is a pleasant 21 year old female with a history of anxiety and depression who presents to [place] with chronic symptoms of abdominal pain, bloating, nausea and vomiting.  She has significant symptoms anytime she tries to eat red meat or pork.  She first noted this in October 2021 while in [place].  She developed severe nausea and vomiting almost immediately when she would try to eat any red meat or pork products.  She denies any known history of tick bites.  She saw her primary provider and asked for alpha gal testing, but was denied.  She continues to have chronic symptoms even in the absence of eating red meat.  The symptoms consist of bloating and gas after eating and decreased appetite.  She also reports that she has to eat within 30 minutes of waking or she will vomit.  She has frequent bowel movements, usually 5 to 6/day.  This is been her norm for the past 4 years.  Her stools are typically liquid or mushy in consistency.  Diarrhea is worsened with eating dairy.  She denies any blood in the stool.  Constipation is not a problem for her.  Sometimes she has crampy lower pain which accompanies her bowel movements.  Her weight is stable.  She did lose 50 pounds after her back surgery 2 years ago, but the weight has been stable since then.   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 Lorna Few in [place])   MDD (major depressive disorder)      Past Surgical History:  Procedure Laterality Date   LUMBAR LAMINECTOMY/DECOMPRESSION MICRODISCECTOMY Left 07/31/2019   Procedure: LUMBAR FIVE-SACRAL ONE DISCECTOMY LEFT SIDE;  Surgeon: Venita Lick, MD;  Location: [facility] OR;  Service: Orthopedics;  Laterality: Left;  2.5 hrs    Family History  Problem Relation Age of Onset   Diabetes Father    Diabetes Maternal Aunt    Diabetes Maternal Uncle    Lung cancer Maternal Grandmother    Heart disease Maternal Grandfather    Heart disease Paternal Grandmother    Colon cancer Other    Esophageal cancer Neg Hx    Pancreatic cancer Neg Hx    Stomach cancer Neg Hx    Social History   Tobacco Use   Smoking status: Never   Smokeless tobacco: Never  Vaping Use   Vaping Use: Every day   Substances: Nicotine  Substance Use Topics   Alcohol use: No   Drug use: No   Current Outpatient Medications  Medication Sig Dispense Refill   traZODone (DESYREL) 150 MG tablet Take 75-150 mg by mouth at bedtime as needed.     TRI-SPRINTEC 0.18/0.215/0.25 MG-35 MCG tablet Take 1 tablet by mouth daily.     venlafaxine XR (EFFEXOR-XR) 75 MG 24 hr capsule Take 75 mg by mouth every morning.     No current facility-administered medications for this visit.   No Known Allergies   Review of Systems: All systems reviewed and negative except where noted in HPI.    No results found.  Physical Exam: BP 128/88    Pulse 92    Ht 5\' 8"  (1.727 m)    Wt 222 lb (100.7 kg)  SpO2 96%    BMI 33.75 kg/m  Constitutional: Pleasant,well-developed, Caucasian female in no acute distress. HEENT: Normocephalic and atraumatic. Conjunctivae are normal. No scleral icterus. Neck supple.  Cardiovascular: Normal rate, regular rhythm.  Pulmonary/chest: Effort normal and breath sounds normal. No wheezing, rales or rhonchi. Abdominal: Soft, nondistended, mild multifocal tenderness to palpation without rigidity or guarding. Bowel sounds active throughout. There are no masses palpable. No hepatomegaly. Extremities: no edema Neurological: Alert and oriented to person place and time. Skin: Skin is warm and dry. No rashes noted. Psychiatric: Normal mood and affect. Behavior is normal.  CBC    Component Value Date/Time   WBC 8.3 07/28/2019 1350   RBC  4.73 07/28/2019 1350   HGB 14.2 07/28/2019 1350   HCT 41.8 07/28/2019 1350   PLT 315 07/28/2019 1350   MCV 88.4 07/28/2019 1350   MCV 90.9 06/18/2013 0919   MCH 30.0 07/28/2019 1350   MCHC 34.0 07/28/2019 1350   RDW 12.7 07/28/2019 1350    CMP     Component Value Date/Time   NA 134 (L) 07/28/2019 1350   K 3.2 (L) 07/28/2019 1350   CL 100 07/28/2019 1350   CO2 24 07/28/2019 1350   GLUCOSE 105 (H) 07/28/2019 1350   BUN 8 07/28/2019 1350   CREATININE 0.81 07/28/2019 1350   CALCIUM 9.1 07/28/2019 1350   GFRNONAA >60 07/28/2019 1350   GFRAA >60 07/28/2019 1350     ASSESSMENT AND PLAN: 21 year old female with 2-year history of abdominal pain, bloating and loose stools, with specific exacerbation of symptoms characterized by severe nausea and vomiting when consuming red meat or pork products.  This specific association of symptoms with consumption of meat products are certainly suggestive of alpha gal allergy, but the fact that she has chronic symptoms in the absence of meat consumption suggests possible irritable bowel disease or other etiologies.  We will check for alpha gal with an alpha gal panel.  Also screen for celiac disease and H. pylori which is known to cause significant bloating and nausea.  Low suspicion for inflammatory bowel disease, but we will get a CRP.  If elevated, will consider CT scan.  Regarding her diarrhea, I suggested she take a stool bulking agent such as Metamucil on a daily basis to improve the stool bulk and reduce urgency and frequency.  I suggested she try taking Bentyl as needed for the crampy abdominal pain.  Bloating, nausea/vomiting, red meat intolerance -Alpha gal panel -TTG/IgA -H. Pylori stool antigen -CRP  Diarrhea -Metamucil daily  -Bentyl 20 mg PO q6hr PRN abd pain  [name], MD [facility]   No ref. provider found

## 2021-05-09 NOTE — Patient Instructions (Signed)
If you are age 23 or older, your body mass index should be between 23-30. Your Body mass index is 33.75 kg/m. If this is out of the aforementioned range listed, please consider follow up with your Primary Care Provider.  If you are age 64 or younger, your body mass index should be between 19-25. Your Body mass index is 33.75 kg/m. If this is out of the aformentioned range listed, please consider follow up with your Primary Care Provider.   Your provider has requested that you go to the basement level for lab work before leaving today. Press "B" on the elevator. The lab is located at the first door on the left as you exit the elevator.  Please purchase Metamucil over the counter. Take as directed.  We have sent the following medications to your pharmacy for you to pick up at your convenience: Bentyl 20 mg every 6 hours as needed.  The [facility] providers would like to encourage you to use [facility] to communicate with providers for non-urgent requests or questions.  Due to long hold times on the telephone, sending your provider a message by [facility] may be a faster and more efficient way to get a response.  Please allow 48 business hours for a response.  Please remember that this is for non-urgent requests.   It was a pleasure to see you today!  Thank you for trusting me with your gastrointestinal care!     Scott E.Tomasa Rand, MD

## 2021-05-11 ENCOUNTER — Encounter: Payer: Self-pay | Admitting: Gastroenterology

## 2021-05-11 ENCOUNTER — Telehealth: Payer: Self-pay | Admitting: Gastroenterology

## 2021-05-11 LAB — ALPHA-GAL PANEL
Beef IgE: <0.1 kU/L (ref <0.35)
Class: 0
Class: 0
Class: 0
Galactose-alpha-1,3-galactose IgE: <0.1 kU/L (ref <0.10)
LAMB/MUTTON IGE: <0.1 kU/L (ref <0.35)
Pork IgE: <0.1 kU/L (ref <0.35)

## 2021-05-11 LAB — TISSUE TRANSGLUTAMINASE, IGA: (tTG) Ab, IgA: <1 U/mL

## 2021-05-11 LAB — H. PYLORI ANTIGEN, STOOL: H pylori Ag, Stl: POSITIVE — AB

## 2021-05-11 LAB — IGA: Immunoglobulin A: 96 mg/dL (ref 47–310)

## 2021-05-11 NOTE — Telephone Encounter (Signed)
Pt called, states she saw her result on mychart and knows that the H pylori test was positive. She is wanting to know the tx plan. Let pt know that once Dr. Tomasa Rand has reviewed the lab results we will call her with the plan.

## 2021-05-11 NOTE — Progress Notes (Signed)
Michelle Blanchard,  Your stool antigen was positive for H. Pylori, which may be the underlying cause for a lot of your GI symptoms.  We will plan on treating with Pylera as below. Please confirm no medication allergies to the prescribed regimen.   1) Pylera (bismuth subcitrate/metronidazole/tetracycline) 3 caps PO QID x 10 days 2) Omeprazole 20 mg PO BID x 10 days  4 weeks after treatment completed, check H. Pylori stool antigen to confirm eradication (must be off acid suppression therapy for 2 weeks prior to specimen submission)  If Pylera is not covered or is cost prohibitive, would then recommend Quadruple therapy 1) Omeprazole 20 mg 2 times a day x 14 d 2) Pepto Bismol 2 tabs (262 mg each) 4 times a day x 14 d 3) Metronidazole 250 mg 4 times a day x 14 d 4) doxycycline 100 mg 2 times a day x 14 d

## 2021-05-11 NOTE — Telephone Encounter (Signed)
Patient called requesting to speak with a nurse regarding her next treatment plan also requested we send all scripts to [facility] on [address] in [place]

## 2021-05-12 ENCOUNTER — Other Ambulatory Visit: Payer: Self-pay

## 2021-05-12 ENCOUNTER — Telehealth: Payer: Self-pay | Admitting: Gastroenterology

## 2021-05-12 DIAGNOSIS — R197 Diarrhea, unspecified: Secondary | ICD-10-CM

## 2021-05-12 DIAGNOSIS — A048 Other specified bacterial intestinal infections: Secondary | ICD-10-CM

## 2021-05-12 MED ORDER — METRONIDAZOLE 250 MG PO TABS: 250.0000 mg | ORAL_TABLET | Freq: Four times a day (QID) | ORAL | 0 refills | Status: DC

## 2021-05-12 MED ORDER — OMEPRAZOLE 20 MG PO CPDR: 20.0000 mg | DELAYED_RELEASE_CAPSULE | Freq: Two times a day (BID) | ORAL | 0 refills | Status: DC

## 2021-05-12 MED ORDER — PYLERA 140-125-125 MG PO CAPS: 3.0000 | ORAL_CAPSULE | Freq: Three times a day (TID) | ORAL | 0 refills | Status: DC

## 2021-05-12 MED ORDER — BISMUTH SUBSALICYLATE 262 MG PO CHEW: 524.0000 mg | CHEWABLE_TABLET | Freq: Three times a day (TID) | ORAL | 0 refills | Status: AC

## 2021-05-12 MED ORDER — DOXYCYCLINE HYCLATE 100 MG PO CAPS: 100.0000 mg | ORAL_CAPSULE | Freq: Two times a day (BID) | ORAL | 0 refills | Status: DC

## 2021-05-12 NOTE — Telephone Encounter (Signed)
See result note.  

## 2021-05-12 NOTE — Telephone Encounter (Signed)
Patient states the Pylera medication requires a PA. Requested a call back once done so she knows.

## 2021-05-12 NOTE — Telephone Encounter (Signed)
Submitted PA for pylera. Attempted to call pt back but was unable to leave voicemail.

## 2021-05-12 NOTE — Telephone Encounter (Signed)
PA denied. Quadruple therapy prescriptions sent to pt's pharmacy. Called pt to let her know. Pt verbalized understanding.

## 2021-06-09 ENCOUNTER — Ambulatory Visit: Payer: 59 | Admitting: Gastroenterology

## 2021-06-23 ENCOUNTER — Encounter: Payer: Self-pay | Admitting: Gastroenterology

## 2021-06-23 ENCOUNTER — Ambulatory Visit (INDEPENDENT_AMBULATORY_CARE_PROVIDER_SITE_OTHER): Payer: 59 | Admitting: Gastroenterology

## 2021-06-23 ENCOUNTER — Other Ambulatory Visit: Payer: 59

## 2021-06-23 VITALS — BP 124/70 | HR 93 | Ht 68.0 in | Wt 224.0 lb

## 2021-06-23 DIAGNOSIS — R140 Abdominal distension (gaseous): Secondary | ICD-10-CM

## 2021-06-23 DIAGNOSIS — A048 Other specified bacterial intestinal infections: Secondary | ICD-10-CM

## 2021-06-23 NOTE — Progress Notes (Signed)
? ?HPI : Michelle Blanchard is a very pleasant 21 year old female with a history of asthma who I initially saw January 23rd with symptoms of bloating, nausea/vomiting and abdominal pain.  An H. Pylori antigen was positive and she was treated with Pylera which she completed on Feb 14th.  Since completing the Pylera she has had significant improvement in her symptoms.  The nausea has completely resolved.  She has not had any abdominal pain.  She is still bothered by constant bloating, but it seems to be less severe than before.  Dairy seems to be a reliable trigger for making the bloating worse.  Her bowel movement frequency decreased transiently while she was taking the antibiotics, but now she is back to her baseline 4-5 loose stools/day. ?She is now able to eat red meat without any problems.  Currently eating a regular diet. ? ? ?Past Medical History:  ?Diagnosis Date  ? Asthma   ? 07/28/2019: per patient "had sports induced asthma in the 3rd grade"  ? GAD (generalized anxiety disorder)   ? Heart murmur   ? 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 Lorna Few in [place])  ? MDD (major depressive disorder)   ? ?H. Pylori infection ? ?Past Surgical History:  ?Procedure Laterality Date  ? LUMBAR LAMINECTOMY/DECOMPRESSION MICRODISCECTOMY Left 07/31/2019  ? Procedure: LUMBAR FIVE-SACRAL ONE DISCECTOMY LEFT SIDE;  Surgeon: Venita Lick, MD;  Location: [facility] OR;  Service: Orthopedics;  Laterality: Left;  2.5 hrs  ? ?Family History  ?Problem Relation Age of Onset  ? Diabetes Father   ? Diabetes Maternal Aunt   ? Diabetes Maternal Uncle   ? Lung cancer Maternal Grandmother   ? Heart disease Maternal Grandfather   ? Heart disease Paternal Grandmother   ? Colon cancer Other   ? Esophageal cancer Neg Hx   ? Pancreatic cancer Neg Hx   ? Stomach cancer Neg Hx   ? ?Social History  ? ?Tobacco Use  ? Smoking status: Never  ? Smokeless tobacco: Never   ?Vaping Use  ? Vaping Use: Every day  ? Substances: Nicotine, Flavoring  ?Substance Use Topics  ? Alcohol use: No  ? Drug use: No  ? ?Current Outpatient Medications  ?Medication Sig Dispense Refill  ? bismuth subsalicylate (PEPTO-BISMOL) 262 MG chewable tablet Chew 2 tablets (524 mg total) by mouth 4 (four) times daily -  before meals and at bedtime. 112 tablet 0  ? traZODone (DESYREL) 150 MG tablet Take 75-150 mg by mouth at bedtime as needed.    ? TRI-SPRINTEC 0.18/0.215/0.25 MG-35 MCG tablet Take 1 tablet by mouth daily.    ? venlafaxine XR (EFFEXOR-XR) 75 MG 24 hr capsule Take 75 mg by mouth every morning.    ? ?No current facility-administered medications for this visit.  ? ?No Known Allergies ? ? ?Review of Systems: ?All systems reviewed and negative except where noted in HPI.  ? ? ?No results found. ? ?Physical Exam: ?BP 124/70   Pulse 93   Ht 5\' 8"  (1.727 m)   Wt 224 lb (101.6 kg)   BMI 34.06 kg/m?  ?Constitutional: Pleasant,well-developed, Caucasian female in no acute distress. ?HEENT: Normocephalic and atraumatic. Conjunctivae are normal. No scleral icterus. ?Neck supple.  ?Abdominal: Soft, nondistended, nontender. Bowel sounds active throughout. There are no masses palpable. No hepatomegaly. ?Extremities: no edema ?Neurological: Alert and oriented to person place and time. ?Skin: Skin is warm and  dry. No rashes noted. ?Psychiatric: Normal mood and affect. Behavior is normal. ? ?CBC ?   ?Component Value Date/Time  ? WBC 8.3 07/28/2019 1350  ? RBC 4.73 07/28/2019 1350  ? HGB 14.2 07/28/2019 1350  ? HCT 41.8 07/28/2019 1350  ? PLT 315 07/28/2019 1350  ? MCV 88.4 07/28/2019 1350  ? MCV 90.9 06/18/2013 0919  ? MCH 30.0 07/28/2019 1350  ? MCHC 34.0 07/28/2019 1350  ? RDW 12.7 07/28/2019 1350  ? ? ?CMP  ?   ?Component Value Date/Time  ? NA 134 (L) 07/28/2019 1350  ? K 3.2 (L) 07/28/2019 1350  ? CL 100 07/28/2019 1350  ? CO2 24 07/28/2019 1350  ? GLUCOSE 105 (H) 07/28/2019 1350  ? BUN 8 07/28/2019 1350  ?  CREATININE 0.81 07/28/2019 1350  ? CALCIUM 9.1 07/28/2019 1350  ? GFRNONAA >60 07/28/2019 1350  ? GFRAA >60 07/28/2019 1350  ? ? ? ?ASSESSMENT AND PLAN: ?21 year old female with chronic bloating, nausea/vomiting/abdominal pain and frequent loose stools, found to have positive H. Pylori stool antigen in January, treated with Pylera with significant improvement in nausea/vomiting and abdominal pain, but continues to be bothered by abdominal bloating.  Will check H. Pylori stool antigen 4 weeks after completion of antibiotics (Mar 14th) to confirm eradication.  ?For her bloating and loose stools, I recommended she consider a trial of a low FODMAP diet and she was interested.  A handout with details of the diet was provided. ? ?H. Pylori infection ?- Check stool antigen to confirm eradication ? ?Bloating ?- Low FODMAP diet ? ?Micahel Omlor E. Tomasa Rand, MD ?[facility] ? ? ?No ref. provider found ? ?

## 2021-06-23 NOTE — Patient Instructions (Addendum)
If you are age 23 or older, your body mass index should be between 23-30. Your Body mass index is 34.06 kg/m?Marland Kitchen If this is out of the aforementioned range listed, please consider follow up with your Primary Care Provider. ? ?If you are age 60 or younger, your body mass index should be between 19-25. Your Body mass index is 34.06 kg/m?Marland Kitchen If this is out of the aformentioned range listed, please consider follow up with your Primary Care Provider.  ? ?Your provider has requested that you go to the basement level for lab work before leaving today. Press "B" on the elevator. The lab is located at the first door on the left as you exit the elevator. ? ?Please follow low fodmap hand out given. ? ?Complete  pylori test after 3/14.  ? ?The [facility] providers would like to encourage you to use [facility] to communicate with providers for non-urgent requests or questions.  Due to long hold times on the telephone, sending your provider a message by [facility] may be a faster and more efficient way to get a response.  Please allow 48 business hours for a response.  Please remember that this is for non-urgent requests.  ? ?It was a pleasure to see you today! ? ?Thank you for trusting me with your gastrointestinal care!   ? ?Scott E.Tomasa Rand, MD  ?

## 2021-06-30 ENCOUNTER — Other Ambulatory Visit: Payer: 59

## 2021-07-02 LAB — H. PYLORI ANTIGEN, STOOL: H pylori Ag, Stl: NEGATIVE

## 2021-07-04 NOTE — Progress Notes (Signed)
Michelle Blanchard,  ?Your H. Pylori stool antigen was negative, indicating that the bacteria was successfully eradicated.  No further evaluation or treatment is recommended.  Please follow up with me as needed for ongoing symptoms.

## 2023-02-01 ENCOUNTER — Emergency Department (HOSPITAL_BASED_OUTPATIENT_CLINIC_OR_DEPARTMENT_OTHER)
Admission: EM | Admit: 2023-02-01 | Discharge: 2023-02-01 | Disposition: A | Discharge status: Home / Self Care | Payer: 59 | Attending: Emergency Medicine | Admitting: Emergency Medicine

## 2023-02-01 ENCOUNTER — Other Ambulatory Visit: Payer: Self-pay

## 2023-02-01 ENCOUNTER — Emergency Department (HOSPITAL_BASED_OUTPATIENT_CLINIC_OR_DEPARTMENT_OTHER): Payer: 59

## 2023-02-01 DIAGNOSIS — R509 Fever, unspecified: Secondary | ICD-10-CM | POA: Diagnosis not present

## 2023-02-01 DIAGNOSIS — R1031 Right lower quadrant pain: Secondary | ICD-10-CM | POA: Insufficient documentation

## 2023-02-01 LAB — COMPREHENSIVE METABOLIC PANEL
ALT: 26 U/L (ref 0–44)
AST: 16 U/L (ref 15–41)
Albumin: 4.5 g/dL (ref 3.5–5.0)
Alkaline Phosphatase: 87 U/L (ref 38–126)
Anion gap: 10 (ref 5–15)
BUN: 8 mg/dL (ref 6–20)
CO2: 23 mmol/L (ref 22–32)
Calcium: 9.5 mg/dL (ref 8.9–10.3)
Chloride: 103 mmol/L (ref 98–111)
Creatinine, Ser: 0.75 mg/dL (ref 0.44–1.00)
GFR, Estimated: >60 mL/min (ref >60)
Glucose, Bld: 82 mg/dL (ref 70–99)
Potassium: 4.5 mmol/L (ref 3.5–5.1)
Sodium: 136 mmol/L (ref 135–145)
Total Bilirubin: 0.3 mg/dL (ref 0.3–1.2)
Total Protein: 8.1 g/dL (ref 6.5–8.1)

## 2023-02-01 LAB — CBC
HCT: 35.9 % — ABNORMAL LOW (ref 36.0–46.0)
Hemoglobin: 12.5 g/dL (ref 12.0–15.0)
MCH: 30.2 pg (ref 26.0–34.0)
MCHC: 34.8 g/dL (ref 30.0–36.0)
MCV: 86.7 fL (ref 80.0–100.0)
Platelets: 322 10*3/uL (ref 150–400)
RBC: 4.14 MIL/uL (ref 3.87–5.11)
RDW: 12.2 % (ref 11.5–15.5)
WBC: 8.7 10*3/uL (ref 4.0–10.5)
nRBC: 0 % (ref 0.0–0.2)

## 2023-02-01 LAB — URINALYSIS, ROUTINE W REFLEX MICROSCOPIC
Bilirubin Urine: NEGATIVE
Glucose, UA: NEGATIVE mg/dL
Hgb urine dipstick: NEGATIVE
Ketones, ur: NEGATIVE mg/dL
Leukocytes,Ua: NEGATIVE
Nitrite: NEGATIVE
Protein, ur: NEGATIVE mg/dL
Specific Gravity, Urine: 1.005 (ref 1.005–1.030)
pH: 6 (ref 5.0–8.0)

## 2023-02-01 LAB — PREGNANCY, URINE: Preg Test, Ur: NEGATIVE

## 2023-02-01 LAB — LIPASE, BLOOD: Lipase: 33 U/L (ref 11–51)

## 2023-02-01 MED ORDER — IOHEXOL 300 MG/ML  SOLN
100.0000 mL | Freq: Once | INTRAMUSCULAR | Status: AC | PRN
  Administered 2023-02-01: 90 mL via INTRAVENOUS

## 2023-02-01 MED ORDER — HYDROMORPHONE HCL 1 MG/ML IJ SOLN
1.0000 mg | Freq: Once | INTRAMUSCULAR | Status: AC
  Administered 2023-02-01: 1 mg via INTRAVENOUS
  Filled 2023-02-01: qty 1

## 2023-02-01 NOTE — ED Provider Notes (Signed)
[place] EMERGENCY DEPARTMENT AT [facility] Provider Note   CSN: [id number] Arrival date & time: 02/01/23  1307     History  Chief Complaint  Patient presents with   Abdominal Pain    Michelle Blanchard is a 22 y.o. female with history of a heart murmur, depression, generalized anxiety disorder, lumbar laminectomy in 2021, H. pylori, presents with concern for right lower abdominal pain that has been ongoing for the past 4 days.  States it is a constant pain that worsens with movement.  Denies any nausea or vomiting, or diarrhea.  Reports a fever to 101.6 two days ago.  Denies any hematuria, dysuria, increased frequency.  Denies any previous abdominal surgeries.   Was seen by urgent care and given antibiotics as they suspected diverticulitis.  Reports this has not helped her symptoms and has given her diarrhea.   Abdominal Pain      Home Medications Prior to Admission medications   Medication Sig Start Date End Date Taking? Authorizing Provider  bismuth subsalicylate (PEPTO-BISMOL) 262 MG chewable tablet Chew 2 tablets (524 mg total) by mouth 4 (four) times daily -  before meals and at bedtime. 05/12/21   Jenel Lucks, MD  traZODone (DESYREL) 150 MG tablet Take 75-150 mg by mouth at bedtime as needed. 05/05/21   [provider]  TRI-SPRINTEC 0.18/0.215/0.25 MG-35 MCG tablet Take 1 tablet by mouth daily. 03/01/21   [provider]  venlafaxine XR (EFFEXOR-XR) 75 MG 24 hr capsule Take 75 mg by mouth every morning. 04/25/21   [provider]      Allergies    Patient has no known allergies.    Review of Systems   Review of Systems  Gastrointestinal:  Positive for abdominal pain.    Physical Exam Updated Vital Signs BP 130/82 (BP Location: Right Arm)   Pulse 82   Temp 98.5 F (36.9 C) (Oral)   Resp 18   Ht 5\' 8"  (1.727 m)   Wt 95.3 kg   SpO2 99%   BMI 31.93 kg/m  Physical Exam Vitals and nursing note reviewed.   Constitutional:      General: She is not in acute distress.    Appearance: She is well-developed.     Comments: Able to sit up and move comfortably  HENT:     Head: Normocephalic and atraumatic.  Eyes:     Conjunctiva/sclera: Conjunctivae normal.  Cardiovascular:     Rate and Rhythm: Normal rate and regular rhythm.     Heart sounds: No murmur heard. Pulmonary:     Effort: Pulmonary effort is normal. No respiratory distress.     Breath sounds: Normal breath sounds.     Comments: Pain with deep inspiration Abdominal:     General: Bowel sounds are normal.     Palpations: Abdomen is soft.     Tenderness: There is no abdominal tenderness.     Comments: Slight right lower quadrant abdominal tenderness to palpation, no rebound or guarding.  No tenderness to palpation elsewhere in the abdomen.  No CVA tenderness bilaterally  Musculoskeletal:        General: No swelling.     Cervical back: Neck supple.  Skin:    General: Skin is warm and dry.     Capillary Refill: Capillary refill takes less than 2 seconds.  Neurological:     Mental Status: She is alert.  Psychiatric:        Mood and Affect: Mood normal.     ED  Results / Procedures / Treatments   Labs (all labs ordered are listed, but only abnormal results are displayed) Labs Reviewed  CBC - Abnormal; Notable for the following components:      Result Value   HCT 35.9 (*)    All other components within normal limits  URINALYSIS, ROUTINE W REFLEX MICROSCOPIC - Abnormal; Notable for the following components:   Color, Urine COLORLESS (*)    All other components within normal limits  LIPASE, BLOOD  COMPREHENSIVE METABOLIC PANEL  PREGNANCY, URINE    EKG None  Radiology CT ABDOMEN PELVIS W CONTRAST  Result Date: 02/01/2023 CLINICAL DATA:  Right lower quadrant pain EXAM: CT ABDOMEN AND PELVIS WITH CONTRAST TECHNIQUE: Multidetector CT imaging of the abdomen and pelvis was performed using the standard protocol following bolus  administration of intravenous contrast. RADIATION DOSE REDUCTION: This exam was performed according to the departmental dose-optimization program which includes automated exposure control, adjustment of the mA and/or kV according to patient size and/or use of iterative reconstruction technique. CONTRAST:  90mL OMNIPAQUE IOHEXOL 300 MG/ML  SOLN COMPARISON:  None Available. FINDINGS: Lower chest: No acute abnormality. Hepatobiliary: No focal liver abnormality is seen. No gallstones, gallbladder wall thickening, or biliary dilatation. Pancreas: Unremarkable. No pancreatic ductal dilatation or surrounding inflammatory changes. Spleen: Normal in size without focal abnormality. Adrenals/Urinary Tract: Adrenal glands are unremarkable. Kidneys are normal, without renal calculi, focal lesion, or hydronephrosis. Bladder is unremarkable. Stomach/Bowel: Stomach is within normal limits. Appendix appears normal. No evidence of bowel wall thickening, distention, or inflammatory changes. Vascular/Lymphatic: No significant vascular findings are present. No enlarged abdominal or pelvic lymph nodes. Reproductive: Uterus and bilateral adnexa are unremarkable. Other: No abdominal wall hernia or abnormality. No abdominopelvic ascites. Musculoskeletal: No acute or significant osseous findings. IMPRESSION: Negative examination. No CT evidence for acute intra-abdominal or pelvic abnormality. Normal appendix. Electronically Signed   By: Jasmine Pang M.D.   On: 02/01/2023 16:59    Procedures Procedures    Medications Ordered in ED Medications  HYDROmorphone (DILAUDID) injection 1 mg (1 mg Intravenous Given 02/01/23 1516)  iohexol (OMNIPAQUE) 300 MG/ML solution 100 mL (90 mLs Intravenous Contrast Given 02/01/23 1555)    ED Course/ Medical Decision Making/ A&P                                 Medical Decision Making Amount and/or Complexity of Data Reviewed Labs: ordered. Radiology: ordered.  Risk Prescription drug  management.   22 y.o. female with pertinent past medical history of heart murmur, depression, generalized anxiety disorder, lumbar laminectomy in 2021 presents to the ED for concern of right lower quadrant pain for the past 4 days, constant in nature but worsens with movement  Differential diagnosis includes but is not limited to UTI, PID, appendicitis, diverticulitis, cholecystitis, ovarian torsion, SBO  ED Course:  Patient overall well-appearing in no acute distress.  Vital signs normal.  Mild tenderness palpation of the right lower quadrant, no rebound or guarding.  States this pain has been ongoing for past 4 days, worsened with movement.  Low suspicion for ovarian torsion at this time given no acute onset pain, ongoing for 4 days.  Still having normal bowel movements, no vomiting, low suspicion for SBO.  She was started on antibiotics by urgent care which she reports that this caused her some diarrhea, but not improved her pain.  No concern for UTI at this time as patient denies urinary symptoms, urinalysis  without nitrates or leukocytes.  CBC without leukocytosis, CMP without any elevation in LFTs, creatinine normal, lipase within normal limits, pregnancy negative.  Given overall unremarkable workup thus far, will obtain CT abdomen pelvis for further evaluation. Patient rates pain at 8/10, patient given Dilaudid for pain Upon re-evaluation, patient reports her pain is improved.  CT abdomen pelvis shows no acute abdominal abnormality.  Patient with no vital sign abnormality, no lab abnormalities, stable for discharge home at this time.  Impression: Right lower quadrant pain  Disposition:  The patient was discharged home with instructions to follow-up with PCP within the next week if symptoms not starting to improve.  Discontinue use of antibiotics prescribed by urgent care.  Tylenol and ibuprofen as needed for pain. Return precautions given.  Lab Tests: I Ordered, and personally interpreted  labs.  The pertinent results include:   CBC without leukocytosis Lipase within normal limits CMP unremarkable Urinalysis without signs of UTI, urine pregnancy negative  Imaging Studies ordered: I ordered imaging studies including CT abdomen pelvis  I independently visualized the imaging with scope of interpretation limited to determining acute life threatening conditions related to emergency care. Imaging showed no acute intra-abdominal abnormalities I agree with the radiologist interpretation   External records from outside source obtained and reviewed including gastroenterology visit from March 2023 showing treatment of  H. pylori             Final Clinical Impression(s) / ED Diagnoses Final diagnoses:  Right lower quadrant abdominal pain    Rx / DC Orders ED Discharge Orders     None         Arabella Merles, PA-C 02/01/23 1734    Royanne Foots, DO 02/05/23 [phone]

## 2023-02-01 NOTE — ED Notes (Signed)
Instructed on providing UA sample from lobby. Sample cup provided.

## 2023-02-01 NOTE — ED Triage Notes (Addendum)
Started on antibiotics for diverticulitis at UC (no scans performed). Has been taking for 72hrs with worsening symptoms. Pain in RLQ and flank. Non-radiating. -N/-V/-D. -Cp,-SOB.

## 2023-02-01 NOTE — Discharge Instructions (Addendum)
Your CT scan today does not show any abnormalities.  We do not see any diverticulitis on your scan.  You may discontinue the antibiotics prescribed by urgent care.  You may take up to 1000mg  of tylenol every 6 hours as needed for pain.  Do not take more then 4g per day.  You may use up to 800mg  ibuprofen every 8 hours as needed for pain.  Do not exceed 2.4g of ibuprofen per day.  Return to the ER for any acute worsening of your abdominal pain, fever, vomiting, if you no longer are having bowel movements, any other new or concerning symptoms

## 2023-02-05 NOTE — Plan of Care (Signed)
[facility] Tonsillectomy/Adenoidectomy, Postoperative PEDS care plan entered in error.

## 2023-09-24 ENCOUNTER — Encounter: Payer: Self-pay | Admitting: Gastroenterology

## 2023-09-24 ENCOUNTER — Ambulatory Visit (INDEPENDENT_AMBULATORY_CARE_PROVIDER_SITE_OTHER): Admitting: Gastroenterology

## 2023-09-24 ENCOUNTER — Other Ambulatory Visit (INDEPENDENT_AMBULATORY_CARE_PROVIDER_SITE_OTHER)

## 2023-09-24 VITALS — BP 120/80 | HR 92 | Ht 68.0 in | Wt 227.0 lb

## 2023-09-24 DIAGNOSIS — K625 Hemorrhage of anus and rectum: Secondary | ICD-10-CM

## 2023-09-24 DIAGNOSIS — K921 Melena: Secondary | ICD-10-CM

## 2023-09-24 DIAGNOSIS — K648 Other hemorrhoids: Secondary | ICD-10-CM

## 2023-09-24 DIAGNOSIS — R197 Diarrhea, unspecified: Secondary | ICD-10-CM

## 2023-09-24 LAB — C-REACTIVE PROTEIN: CRP: <1 mg/dL (ref 0.5–20.0)

## 2023-09-24 MED ORDER — METAMUCIL SMOOTH TEXTURE 58.6 % PO POWD: 1.0000 | Freq: Every day | ORAL | Status: AC

## 2023-09-24 NOTE — Progress Notes (Unsigned)
 HPI :       CT abdomen/pelvis with contrast February 01, 2023 IMPRESSION: Negative examination. No CT evidence for acute intra-abdominal or pelvic abnormality. 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 Marchelle Sessions in [place])   MDD (major depressive disorder)      Past Surgical History:  Procedure Laterality Date   LUMBAR LAMINECTOMY/DECOMPRESSION MICRODISCECTOMY Left 07/31/2019   Procedure: LUMBAR FIVE-SACRAL ONE DISCECTOMY LEFT SIDE;  Surgeon: Mort Ards, MD;  Location: [facility] OR;  Service: Orthopedics;  Laterality: Left;  2.5 hrs   Family History  Problem Relation Age of Onset   Diabetes Father    Diabetes Maternal Aunt    Diabetes Maternal Uncle    Lung cancer Maternal Grandmother    Heart disease Maternal Grandfather    Heart disease Paternal Grandmother    Colon cancer Other    Esophageal cancer Neg Hx    Pancreatic cancer Neg Hx    Stomach cancer Neg Hx    Social History   Tobacco Use   Smoking status: Never   Smokeless tobacco: Never  Vaping Use   Vaping status: Every Day   Substances: Nicotine, Flavoring  Substance Use Topics   Alcohol use: No   Drug use: No   Current Outpatient Medications  Medication Sig Dispense Refill   busPIRone (BUSPAR) 10 MG tablet Take 5-10 mg by mouth 2 (two) times daily as needed.     desvenlafaxine (PRISTIQ) 50 MG 24 hr tablet Take 25 mg by mouth daily.     levonorgestrel (MIRENA) 20 MCG/DAY IUD 1 each by Intrauterine route once.     traZODone (DESYREL) 150 MG tablet Take 75-150 mg by mouth at bedtime as needed.     bismuth  subsalicylate (PEPTO-BISMOL) 262 MG chewable tablet Chew 2 tablets (524 mg total) by mouth 4 (four) times daily -  before meals  and at bedtime. (Patient not taking: Reported on 09/24/2023) 112 tablet 0   TRI-SPRINTEC 0.18/0.215/0.25 MG-35 MCG tablet Take 1 tablet by mouth daily. (Patient not taking: Reported on 09/24/2023)     venlafaxine XR (EFFEXOR-XR) 75 MG 24 hr capsule Take 75 mg by mouth every morning. (Patient not taking: Reported on 09/24/2023)     No current facility-administered medications for this visit.   No Known Allergies   Review of Systems: All systems reviewed and negative except where noted in HPI.    No results found.  Physical Exam: BP 120/80   Pulse 92   Ht 5\' 8"  (1.727 m)   Wt 227 lb (103 kg)   BMI 34.52 kg/m  Constitutional: Pleasant,well-developed, ***female in no acute distress. HEENT: Normocephalic and atraumatic. Conjunctivae are normal. No scleral icterus. Neck supple.  Cardiovascular: Normal rate, regular rhythm.  Pulmonary/chest: Effort normal and breath sounds normal. No wheezing, rales or rhonchi. Abdominal: Soft, nondistended, nontender. Bowel sounds active throughout. There are no masses palpable. No hepatomegaly. Extremities: no edema Lymphadenopathy: No cervical adenopathy noted. Neurological: Alert and oriented to person place and time. Skin: Skin is warm and dry. No rashes noted. Psychiatric: Normal mood and affect. Behavior is normal.  CBC    Component Value Date/Time   WBC 8.7 02/01/2023 1344   RBC  4.14 02/01/2023 1344   HGB 12.5 02/01/2023 1344   HCT 35.9 (L) 02/01/2023 1344   PLT 322 02/01/2023 1344   MCV 86.7 02/01/2023 1344   MCV 90.9 06/18/2013 0919   MCH 30.2 02/01/2023 1344   MCHC 34.8 02/01/2023 1344   RDW 12.2 02/01/2023 1344    CMP     Component Value Date/Time   NA 136 02/01/2023 1344   K 4.5 02/01/2023 1344   CL 103 02/01/2023 1344   CO2 23 02/01/2023 1344   GLUCOSE 82 02/01/2023 1344   BUN 8 02/01/2023 1344   CREATININE 0.75 02/01/2023 1344   CALCIUM 9.5 02/01/2023 1344   PROT 8.1 02/01/2023 1344   ALBUMIN 4.5 02/01/2023 1344   AST  16 02/01/2023 1344   ALT 26 02/01/2023 1344   ALKPHOS 87 02/01/2023 1344   BILITOT 0.3 02/01/2023 1344   GFRNONAA >60 02/01/2023 1344   GFRAA >60 07/28/2019 1350       Latest Ref Rng & Units 02/01/2023    1:44 PM 07/28/2019    1:50 PM 06/18/2013    9:19 AM  CBC EXTENDED  WBC 4.0 - 10.5 K/uL 8.7  8.3  6.8   RBC 3.87 - 5.11 MIL/uL 4.14  4.73  3.9   Hemoglobin 12.0 - 15.0 g/dL 28.4  13.2  44.0   HCT 36.0 - 46.0 % 35.9  41.8  35.1   Platelets 150 - 400 K/uL 322  315        ASSESSMENT AND PLAN:  [facility]

## 2023-09-24 NOTE — Patient Instructions (Addendum)
 Please go to the lab in the basement of our building to have lab work done as you leave today. Hit "B" for basement when you get on the elevator.  When the doors open the lab is on your left.  We will call you with the results. Thank you.  Please purchase the following medications over the counter and take as directed: Metamucil - use as directed daily  I recommend a food diary.  Thank you for entrusting me with your care and for choosing [facility],  Dr. Darol Elizabeth   If your blood pressure at your visit was 140/90 or greater, please contact your primary care physician to follow up on this. ______________________________________________________  If you are age 25 or older, your body mass index should be between 23-30. Your Body mass index is 34.52 kg/m. If this is out of the aforementioned range listed, please consider follow up with your Primary Care Provider.  If you are age 61 or younger, your body mass index should be between 19-25. Your Body mass index is 34.52 kg/m. If this is out of the aformentioned range listed, please consider follow up with your Primary Care Provider.  ________________________________________________________  The [facility] providers would like to encourage you to use MYCHART to communicate with providers for non-urgent requests or questions.  Due to long hold times on the telephone, sending your provider a message by [facility] may be a faster and more efficient way to get a response.  Please allow 48 business hours for a response.  Please remember that this is for non-urgent requests.  _______________________________________________________  Due to recent changes in healthcare laws, you may see the results of your imaging and laboratory studies on MyChart before your provider has had a chance to review them.  We understand that in some cases there may be results that are confusing or concerning to you. Not all laboratory results come back in the same  time frame and the provider may be waiting for multiple results in order to interpret others.  Please give us  48 hours in order for your provider to thoroughly review all the results before contacting the office for clarification of your results.

## 2023-09-26 ENCOUNTER — Ambulatory Visit: Payer: Self-pay | Admitting: Gastroenterology

## 2023-09-26 NOTE — Progress Notes (Signed)
 Margretta Shi, Your CRP was normal.  Will await lactoferrin results
